# Patient Record
Sex: Male | Born: 1950 | Race: White | Hispanic: No | Marital: Married | Smoking: Never smoker
Health system: Southern US, Community
[De-identification: ages and names within clinical notes are randomized; demographics above are authoritative.]

## PROBLEM LIST (undated history)

## (undated) DIAGNOSIS — E785 Hyperlipidemia, unspecified: Secondary | ICD-10-CM

## (undated) DIAGNOSIS — E119 Type 2 diabetes mellitus without complications: Secondary | ICD-10-CM

## (undated) DIAGNOSIS — I10 Essential (primary) hypertension: Secondary | ICD-10-CM

## (undated) DIAGNOSIS — K219 Gastro-esophageal reflux disease without esophagitis: Secondary | ICD-10-CM

## (undated) DIAGNOSIS — F419 Anxiety disorder, unspecified: Secondary | ICD-10-CM

## (undated) DIAGNOSIS — E039 Hypothyroidism, unspecified: Secondary | ICD-10-CM

## (undated) HISTORY — DX: Anxiety disorder, unspecified: F41.9

## (undated) HISTORY — PX: ROTATOR CUFF REPAIR: SHX139

## (undated) HISTORY — PX: OTHER SURGICAL HISTORY: SHX169

## (undated) HISTORY — PX: MENISCUS REPAIR: SHX5179

## (undated) HISTORY — DX: Hyperlipidemia, unspecified: E78.5

## (undated) HISTORY — DX: Type 2 diabetes mellitus without complications: E11.9

## (undated) HISTORY — PX: JOINT REPLACEMENT: SHX530

## (undated) HISTORY — DX: Gastro-esophageal reflux disease without esophagitis: K21.9

## (undated) HISTORY — PX: TONSILLECTOMY: SUR1361

---

## 2015-05-05 ENCOUNTER — Encounter: Payer: Self-pay | Admitting: Emergency Medicine

## 2015-05-05 ENCOUNTER — Ambulatory Visit
Admission: EM | Admit: 2015-05-05 | Discharge: 2015-05-05 | Disposition: A | Discharge status: Home / Self Care | Payer: BLUE CROSS/BLUE SHIELD | Attending: Family Medicine | Admitting: Family Medicine

## 2015-05-05 DIAGNOSIS — J209 Acute bronchitis, unspecified: Secondary | ICD-10-CM | POA: Diagnosis not present

## 2015-05-05 DIAGNOSIS — J069 Acute upper respiratory infection, unspecified: Secondary | ICD-10-CM | POA: Diagnosis not present

## 2015-05-05 HISTORY — DX: Essential (primary) hypertension: I10

## 2015-05-05 HISTORY — DX: Hypothyroidism, unspecified: E03.9

## 2015-05-05 MED ORDER — PREDNISONE 10 MG (21) PO TBPK: ORAL_TABLET | ORAL | Status: DC

## 2015-05-05 MED ORDER — HYDROCOD POLST-CPM POLST ER 10-8 MG/5ML PO SUER: 5.0000 mL | Freq: Two times a day (BID) | ORAL | Status: DC | PRN

## 2015-05-05 MED ORDER — AZITHROMYCIN 250 MG PO TABS: ORAL_TABLET | ORAL | Status: DC

## 2015-05-05 MED ORDER — ALBUTEROL SULFATE HFA 108 (90 BASE) MCG/ACT IN AERS: 2.0000 | INHALATION_SPRAY | RESPIRATORY_TRACT | Status: DC | PRN

## 2015-05-05 NOTE — ED Notes (Signed)
Pt states he has bronchitis

## 2015-05-05 NOTE — ED Provider Notes (Signed)
CSN: [id number]     Arrival date & time 05/05/15  1439 History   First MD Initiated Contact with Patient 05/05/15 1512    Nurses notes were reviewed.  Chief Complaint  Patient presents with  . Bronchitis   patient is here for bronchitis. He reports history of getting bronchitis either every other year or yearly. He states this time he got his usual irritation of the throat cough and then barking cough hoarseness and productive cough but this time is still progressing quickly it's taken about a month. He's had the coughing and irritation throat about a month over the last several days felt that it has been moving into his chest and cough is gotten a lot worse with finally brownish sputum being produced today. He states he is not noticing wheezing this time but that does occur often when he does develop bronchitis and the cough has been keeping him up at night as well. He denies any fever. No history of smoking and no smokes around him either.  (Consider location/radiation/quality/duration/timing/severity/associated sxs/prior Treatment) Patient is a 64 y.o. male presenting with URI. The history is provided by the patient and the spouse. No language interpreter was used.  URI Presenting symptoms: congestion, cough and facial pain   Presenting symptoms: no ear pain, no fever and no sore throat   Severity:  Moderate Onset quality:  Unable to specify Duration:  4 weeks Progression:  Worsening Chronicity:  New Relieved by:  Nothing Ineffective treatments:  OTC medications Associated symptoms: headaches   Associated symptoms: no myalgias, no sinus pain, no sneezing and no wheezing   Risk factors: not elderly, no chronic cardiac disease, no immunosuppression and no recent illness     Past Medical History  Diagnosis Date  . Hypertension   . Thyroid activity decreased    Past Surgical History  Procedure Laterality Date  . Rotate     History reviewed. No pertinent family history. Social  History  Substance Use Topics  . Smoking status: Never Smoker   . Smokeless tobacco: None  . Alcohol Use: No  He is a retired Art therapist.   Review of Systems  Constitutional: Negative for fever.  HENT: Positive for congestion. Negative for ear pain, sneezing and sore throat.   Respiratory: Positive for cough. Negative for wheezing.   Musculoskeletal: Negative for myalgias.  Neurological: Positive for headaches.  All other systems reviewed and are negative.   Allergies  Fentanyl  Home Medications   Prior to Admission medications   Medication Sig Start Date End Date Taking? Authorizing Provider  dicyclomine (BENTYL) 10 MG capsule Take 10 mg by mouth 4 (four) times daily -  before meals and at bedtime.   Yes Historical Provider, MD  fluticasone (FLONASE) 50 MCG/ACT nasal spray Place into both nostrils daily.   Yes Historical Provider, MD  lisinopril (PRINIVIL,ZESTRIL) 10 MG tablet Take 10 mg by mouth daily.   Yes Historical Provider, MD  LORazepam (ATIVAN) 0.5 MG tablet Take 0.5 mg by mouth every 8 (eight) hours.   Yes Historical Provider, MD  metoprolol succinate (TOPROL-XL) 50 MG 24 hr tablet Take 50 mg by mouth daily. Take with or immediately following a meal.   Yes Historical Provider, MD  omeprazole (PRILOSEC) 20 MG capsule Take 20 mg by mouth daily.   Yes Historical Provider, MD  simvastatin (ZOCOR) 10 MG tablet Take 10 mg by mouth daily.   Yes Historical Provider, MD  venlafaxine XR (EFFEXOR-XR) 37.5 MG 24 hr capsule Take 37.5 mg  by mouth daily with breakfast.   Yes Historical Provider, MD  albuterol (PROVENTIL HFA;VENTOLIN HFA) 108 (90 BASE) MCG/ACT inhaler Inhale 2 puffs into the lungs every 4 (four) hours as needed for wheezing or shortness of breath. 05/05/15   Frederich Cha, MD  azithromycin (ZITHROMAX Z-PAK) 250 MG tablet Take 2 tablets first day and then 1 po a day for 4 days 05/05/15   Frederich Cha, MD  chlorpheniramine-HYDROcodone [facility]) 10-8 MG/5ML  SUER Take 5 mLs by mouth every 12 (twelve) hours as needed for cough. 05/05/15   Frederich Cha, MD  predniSONE (STERAPRED UNI-PAK 21 TAB) 10 MG (21) TBPK tablet Sig 6 tablet day 1, 5 tablets day 2, 4 tablets day 3,,3tablets day 4, 2 tablets day 5, 1 tablet day 6 take all tablets orally 05/05/15   Frederich Cha, MD   Meds Ordered and Administered this Visit  Medications - No data to display  BP 140/79 mmHg  Pulse 67  Temp(Src) 98.1 F (36.7 C) (Tympanic)  Resp 20  Ht 5\' 10"  (1.778 m)  Wt 199 lb (90.266 kg)  BMI 28.55 kg/m2  SpO2 98% No data found.   Physical Exam  Constitutional: He is oriented to person, place, and time. He appears well-developed and well-nourished.  HENT:  Head: Atraumatic.  Eyes: Conjunctivae are normal. Pupils are equal, round, and reactive to light.  Neck: Neck supple.  Cardiovascular: Normal rate, regular rhythm and normal heart sounds.   Pulmonary/Chest: Effort normal and breath sounds normal. No respiratory distress.  Musculoskeletal: Normal range of motion. He exhibits no edema.  Neurological: He is alert and oriented to person, place, and time. He has normal reflexes.  Skin: Skin is warm and dry.  Psychiatric: He has a normal mood and affect. His behavior is normal.  Vitals reviewed.   ED Course  Procedures (including critical care time)  Labs Review Labs Reviewed - No data to display  Imaging Review No results found.   Visual Acuity Review  Right Eye Distance:   Left Eye Distance:   Bilateral Distance:    Right Eye Near:   Left Eye Near:    Bilateral Near:         MDM   1. Acute bronchitis, unspecified organism   2. URI (upper respiratory infection)     he states that sometimes he has to have a second round of antibiotics I asked him call me back next Thursday if he feels he needs more of the current antibiotic were placed him on. He declined work note. We'll place him on a Z-Pak 60 course of prednisone, albuterol inhaler if needed  and Tussionex 1 teaspoon twice a day when necessary for the cough.  Frederich Cha, MD 05/05/15 1537

## 2015-05-05 NOTE — Discharge Instructions (Signed)
Acute Bronchitis Bronchitis is when the airways that extend from the windpipe into the lungs get red, puffy, and painful (inflamed). Bronchitis often causes thick spit (mucus) to develop. This leads to a cough. A cough is the most common symptom of bronchitis. In acute bronchitis, the condition usually begins suddenly and goes away over time (usually in 2 weeks). Smoking, allergies, and asthma can make bronchitis worse. Repeated episodes of bronchitis may cause more lung problems. HOME CARE  Rest.  Drink enough fluids to keep your pee (urine) clear or pale yellow (unless you need to limit fluids as told by your doctor).  Only take over-the-counter or prescription medicines as told by your doctor.  Avoid smoking and secondhand smoke. These can make bronchitis worse. If you are a smoker, think about using nicotine gum or skin patches. Quitting smoking will help your lungs heal faster.  Reduce the chance of getting bronchitis again by:  Washing your hands often.  Avoiding people with cold symptoms.  Trying not to touch your hands to your mouth, nose, or eyes.  Follow up with your doctor as told. GET HELP IF: Your symptoms do not improve after 1 week of treatment. Symptoms include:  Cough.  Fever.  Coughing up thick spit.  Body aches.  Chest congestion.  Chills.  Shortness of breath.  Sore throat. GET HELP RIGHT AWAY IF:   You have an increased fever.  You have chills.  You have severe shortness of breath.  You have bloody thick spit (sputum).  You throw up (vomit) often.  You lose too much body fluid (dehydration).  You have a severe headache.  You faint. MAKE SURE YOU:   Understand these instructions.  Will watch your condition.  Will get help right away if you are not doing well or get worse.   This information is not intended to replace advice given to you by your health care provider. Make sure you discuss any questions you have with your health care  provider.   Document Released: 12/11/2007 Document Revised: 02/24/2013 Document Reviewed: 12/15/2012 Elsevier Interactive Patient Education 2016 Else[employer]  Upper Respiratory Infection, Adult Most upper respiratory infections (URIs) are caused by a virus. A URI affects the nose, throat, and upper air passages. The most common type of URI is often called "the common cold." HOME CARE   Take medicines only as told by your doctor.  Gargle warm saltwater or take cough drops to comfort your throat as told by your doctor.  Use a warm mist humidifier or inhale steam from a shower to increase air moisture. This may make it easier to breathe.  Drink enough fluid to keep your pee (urine) clear or pale yellow.  Eat soups and other clear broths.  Have a healthy diet.  Rest as needed.  Go back to work when your fever is gone or your doctor says it is okay.  You may need to stay home longer to avoid giving your URI to others.  You can also wear a face mask and wash your hands often to prevent spread of the virus.  Use your inhaler more if you have asthma.  Do not use any tobacco products, including cigarettes, chewing tobacco, or electronic cigarettes. If you need help quitting, ask your doctor. GET HELP IF:  You are getting worse, not better.  Your symptoms are not helped by medicine.  You have chills.  You are getting more short of breath.  You have brown or red mucus.  You have  yellow or brown discharge from your nose.  You have pain in your face, especially when you bend forward.  You have a fever.  You have puffy (swollen) neck glands.  You have pain while swallowing.  You have white areas in the back of your throat. GET HELP RIGHT AWAY IF:   You have very bad or constant:  Headache.  Ear pain.  Pain in your forehead, behind your eyes, and over your cheekbones (sinus pain).  Chest pain.  You have long-lasting (chronic) lung disease and any of the  following:  Wheezing.  Long-lasting cough.  Coughing up blood.  A change in your usual mucus.  You have a stiff neck.  You have changes in your:  Vision.  Hearing.  Thinking.  Mood. MAKE SURE YOU:   Understand these instructions.  Will watch your condition.  Will get help right away if you are not doing well or get worse.   This information is not intended to replace advice given to you by your health care provider. Make sure you discuss any questions you have with your health care provider.   Document Released: 12/11/2007 Document Revised: 11/08/2014 Document Reviewed: 09/29/2013 Elsevier Interactive Patient Education [employer].

## 2015-06-21 DIAGNOSIS — F4329 Adjustment disorder with other symptoms: Secondary | ICD-10-CM | POA: Insufficient documentation

## 2015-06-21 DIAGNOSIS — B002 Herpesviral gingivostomatitis and pharyngotonsillitis: Secondary | ICD-10-CM | POA: Insufficient documentation

## 2015-06-21 DIAGNOSIS — F4323 Adjustment disorder with mixed anxiety and depressed mood: Secondary | ICD-10-CM | POA: Insufficient documentation

## 2015-06-21 DIAGNOSIS — I10 Essential (primary) hypertension: Secondary | ICD-10-CM | POA: Insufficient documentation

## 2015-06-21 DIAGNOSIS — E782 Mixed hyperlipidemia: Secondary | ICD-10-CM | POA: Insufficient documentation

## 2015-06-21 DIAGNOSIS — E039 Hypothyroidism, unspecified: Secondary | ICD-10-CM | POA: Insufficient documentation

## 2016-02-20 ENCOUNTER — Other Ambulatory Visit: Payer: Self-pay | Admitting: Family Medicine

## 2016-02-20 ENCOUNTER — Ambulatory Visit
Admission: RE | Admit: 2016-02-20 | Discharge: 2016-02-20 | Disposition: A | Discharge status: Home / Self Care | Payer: BLUE CROSS/BLUE SHIELD | Source: Ambulatory Visit | Attending: Family Medicine | Admitting: Family Medicine

## 2016-02-20 DIAGNOSIS — M79604 Pain in right leg: Secondary | ICD-10-CM

## 2016-02-20 DIAGNOSIS — M79605 Pain in left leg: Secondary | ICD-10-CM

## 2016-02-20 DIAGNOSIS — M7989 Other specified soft tissue disorders: Secondary | ICD-10-CM | POA: Insufficient documentation

## 2016-02-20 DIAGNOSIS — R609 Edema, unspecified: Secondary | ICD-10-CM

## 2016-06-06 IMAGING — US US EXTREM LOW VENOUS*L*
1 series · 13 of 24 positions shown · non-contrast
Comparison: None.

CLINICAL DATA: Left lower extremity pain and edema since
experiencing left leg muscle tear last week. History of skin cancer.
Evaluate for DVT.



[Series 1: us extrem low venous*left* · 0.09mm/px · 13 of 32 slices shown]
[im 1/32]
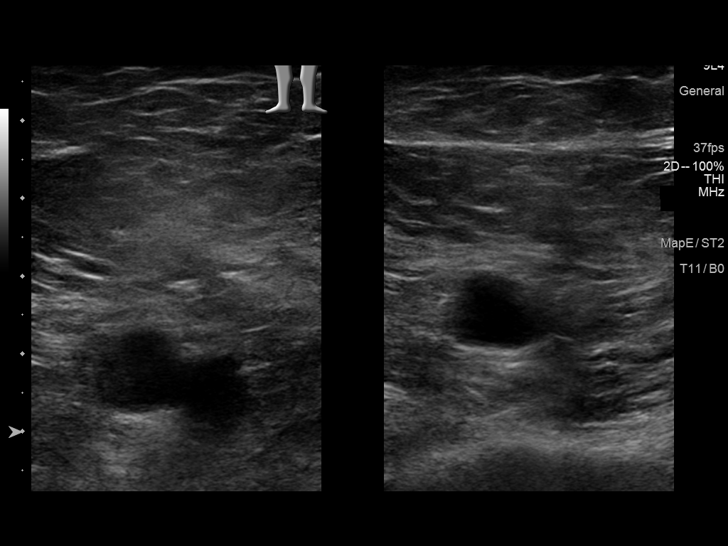
[im 3/32]
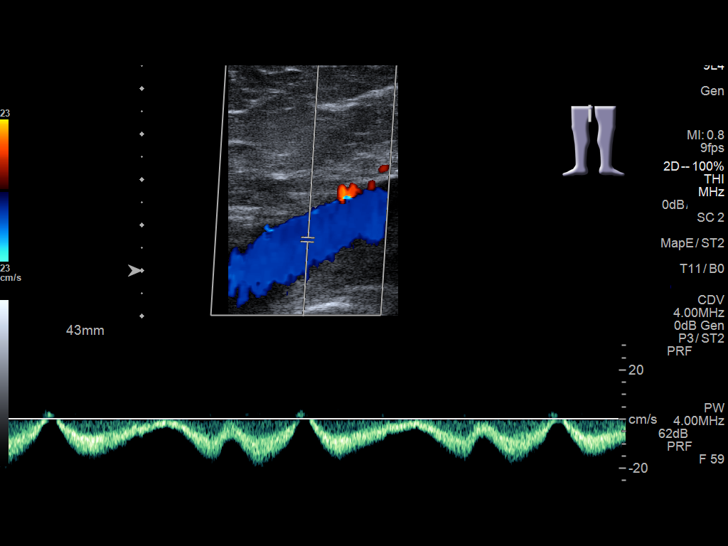
[im 6/32]
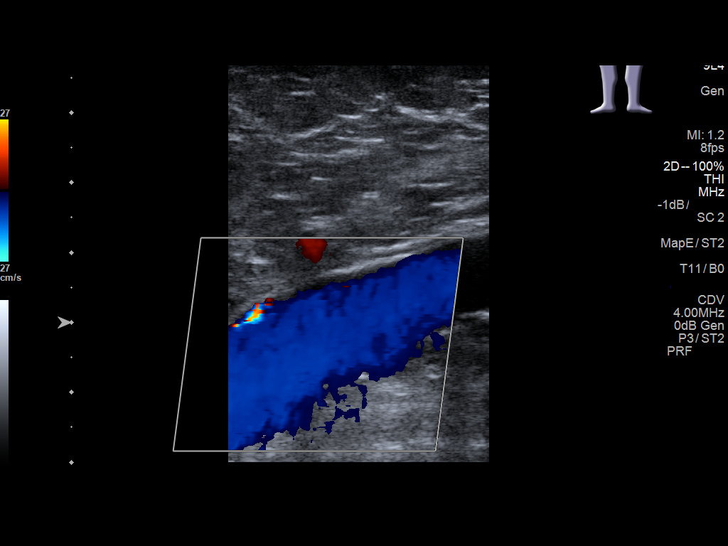
[im 9/32]
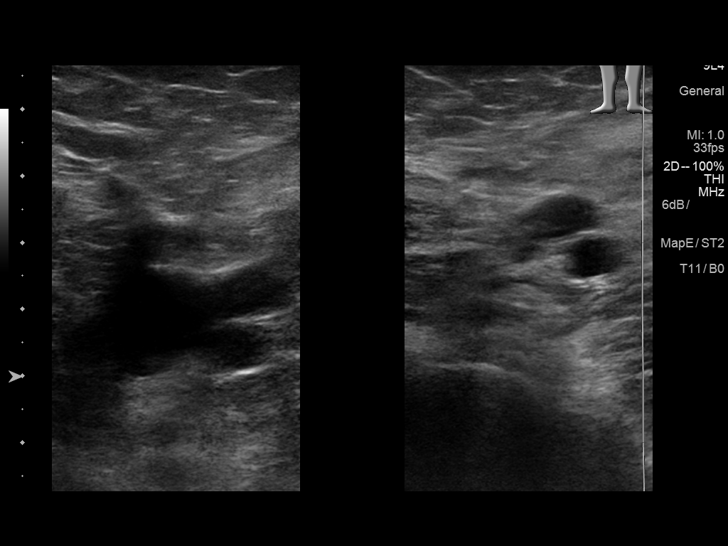
[im 11/32]
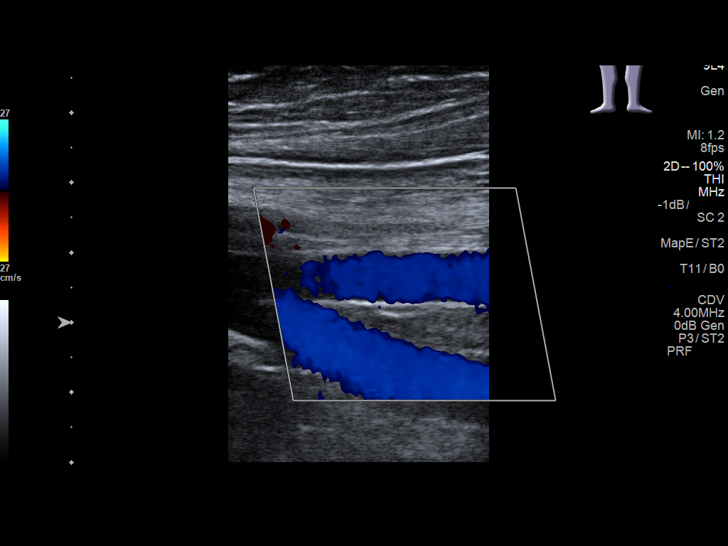
[im 14/32]
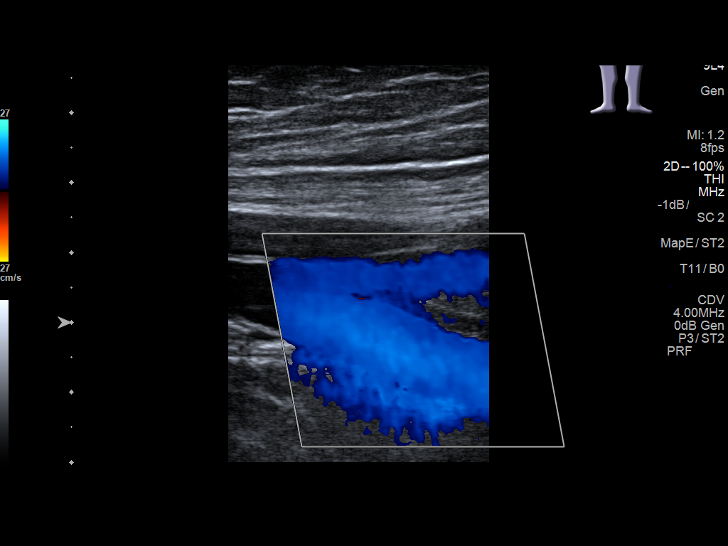
[im 17/32]
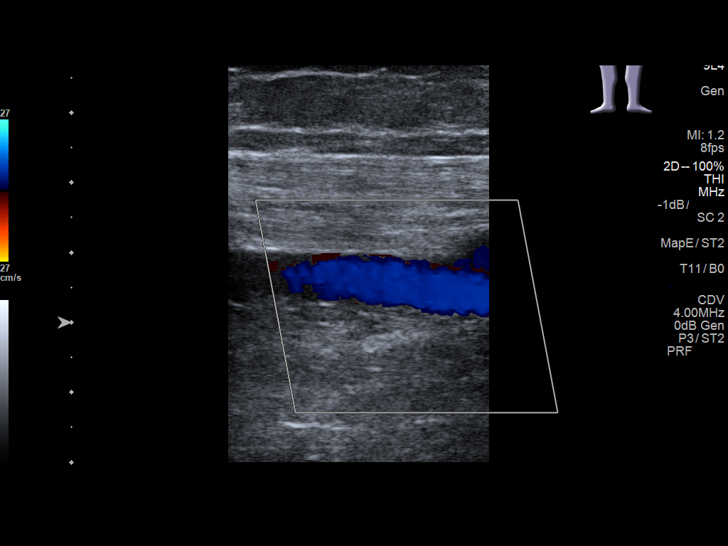
[im 18/32]
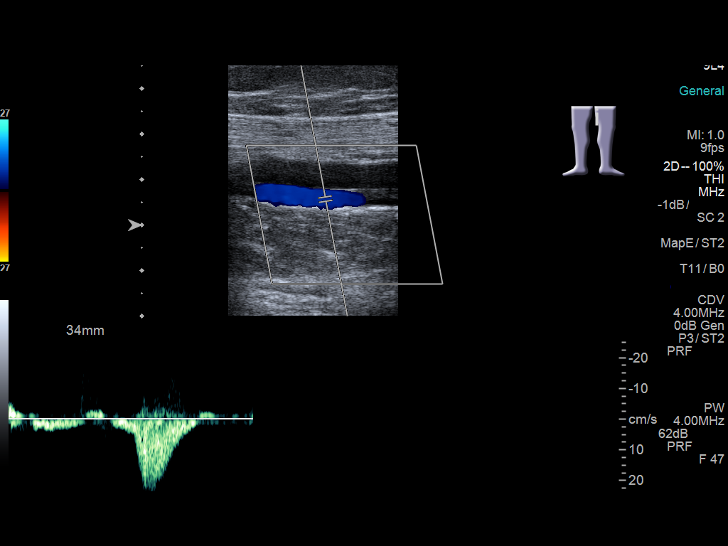
[im 21/32]
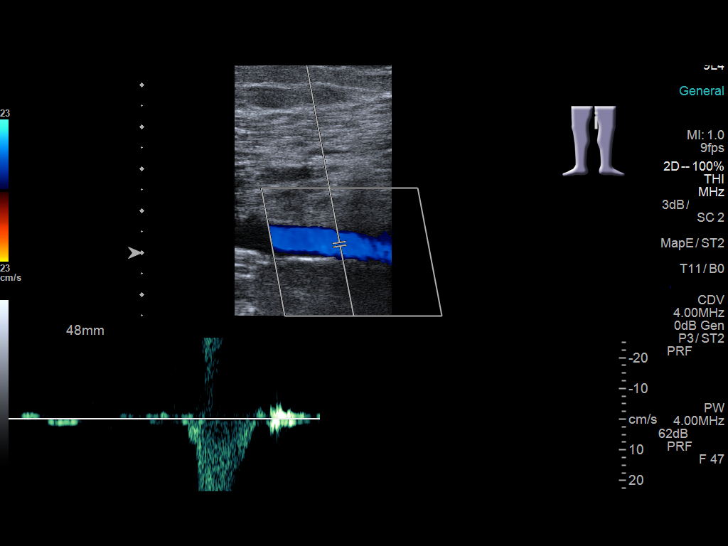
[im 23/32]
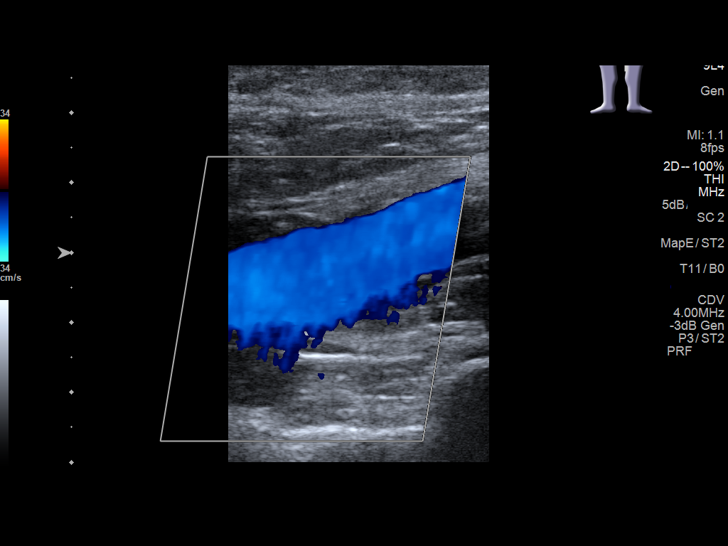
[im 26/32]
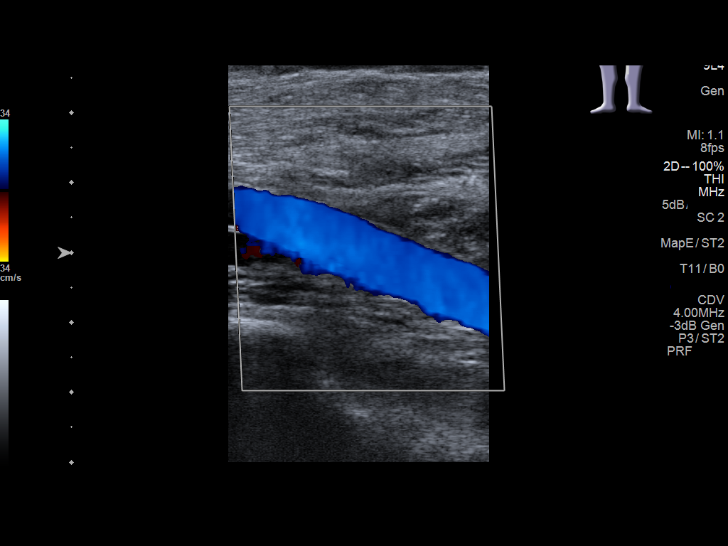
[im 29/32]
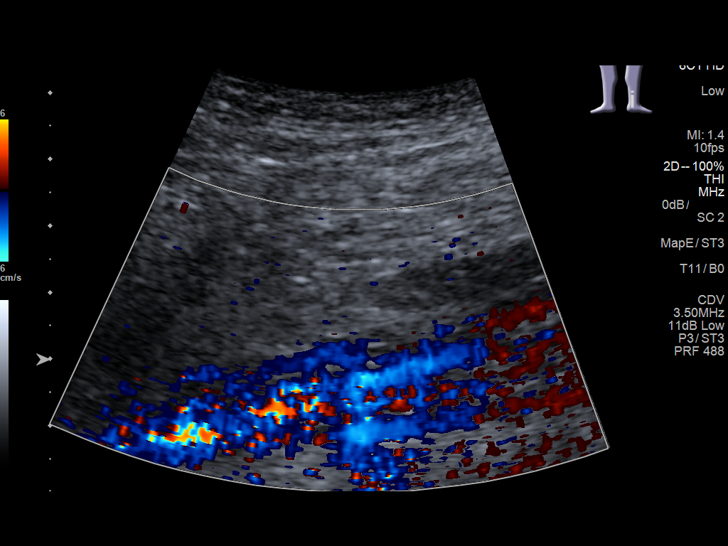
[im 32/32]
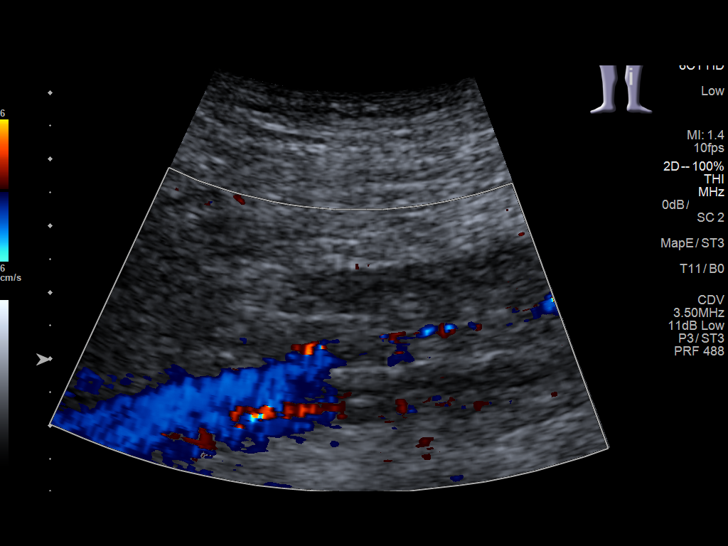

[13 of 24 positions shown; findings below may reference images not displayed]

FINDINGS: Contralateral Common Femoral Vein: Respiratory phasicity is normal
and symmetric with the symptomatic side. No evidence of thrombus.
Normal compressibility.

Common Femoral Vein: No evidence of thrombus. Normal
compressibility, respiratory phasicity and response to augmentation.

Saphenofemoral Junction: No evidence of thrombus. Normal
compressibility and flow on color Doppler imaging.

Profunda Femoral Vein: No evidence of thrombus. Normal
compressibility and flow on color Doppler imaging.

Femoral Vein: No evidence of thrombus. Normal compressibility,
respiratory phasicity and response to augmentation.

Popliteal Vein: No evidence of thrombus. Normal compressibility,
respiratory phasicity and response to augmentation.

Calf Veins: No evidence of thrombus. Normal compressibility and flow
on color Doppler imaging.

Superficial Great Saphenous Vein: No evidence of thrombus. Normal
compressibility and flow on color Doppler imaging.

Venous Reflux:  None.

Other Findings:  None.
IMPRESSION: No evidence of DVT within the left lower extremity.

## 2016-06-29 ENCOUNTER — Encounter: Payer: Self-pay | Admitting: Emergency Medicine

## 2016-06-29 ENCOUNTER — Ambulatory Visit
Admission: EM | Admit: 2016-06-29 | Discharge: 2016-06-29 | Disposition: A | Discharge status: Home / Self Care | Payer: BLUE CROSS/BLUE SHIELD | Attending: Family Medicine | Admitting: Family Medicine

## 2016-06-29 DIAGNOSIS — J40 Bronchitis, not specified as acute or chronic: Secondary | ICD-10-CM | POA: Diagnosis not present

## 2016-06-29 DIAGNOSIS — R05 Cough: Secondary | ICD-10-CM | POA: Diagnosis not present

## 2016-06-29 DIAGNOSIS — R059 Cough, unspecified: Secondary | ICD-10-CM

## 2016-06-29 MED ORDER — PREDNISONE 20 MG PO TABS: 20.0000 mg | ORAL_TABLET | Freq: Every day | ORAL | 0 refills | Status: DC

## 2016-06-29 MED ORDER — AZITHROMYCIN 250 MG PO TABS: ORAL_TABLET | ORAL | 0 refills | Status: DC

## 2016-06-29 MED ORDER — BENZONATATE 100 MG PO CAPS: 100.0000 mg | ORAL_CAPSULE | Freq: Three times a day (TID) | ORAL | 0 refills | Status: DC

## 2016-06-29 NOTE — ED Provider Notes (Signed)
[facility]    CSN: [id number] Arrival date & time: 06/29/16  0903     History   Chief Complaint Chief Complaint  Patient presents with  . Cough    HPI Jonathon Wise is a 65 y.o. male.   The history is provided by the patient.  Cough  Associated symptoms: fever   Associated symptoms: no headaches, no myalgias and no wheezing   URI  Presenting symptoms: cough, fatigue and fever   Severity:  Moderate Onset quality:  Sudden Duration:  8 days Timing:  Constant Progression:  Worsening Chronicity:  New Relieved by:  Nothing Ineffective treatments:  OTC medications Associated symptoms: no arthralgias, no headaches, no myalgias, no neck pain, no sinus pain, no sneezing, no swollen glands and no wheezing   Risk factors: sick contacts   Risk factors: not elderly, no chronic cardiac disease, no chronic kidney disease, no chronic respiratory disease, no diabetes mellitus, no immunosuppression, no recent illness and no recent travel     Past Medical History:  Diagnosis Date  . Hypertension   . Thyroid activity decreased     There are no active problems to display for this patient.   Past Surgical History:  Procedure Laterality Date  . rotate         Home Medications    Prior to Admission medications   Medication Sig Start Date End Date Taking? Authorizing Provider  azithromycin (ZITHROMAX Z-PAK) 250 MG tablet 2 tabs po once day 1, then 1 tab po qd for next 4 days 06/29/16   Norval Gable, MD  benzonatate (TESSALON) 100 MG capsule Take 1 capsule (100 mg total) by mouth every 8 (eight) hours. 06/29/16   Norval Gable, MD  fluticasone (FLONASE) 50 MCG/ACT nasal spray Place into both nostrils daily.    Historical Provider, MD  lisinopril (PRINIVIL,ZESTRIL) 10 MG tablet Take 10 mg by mouth daily.    Historical Provider, MD  LORazepam (ATIVAN) 0.5 MG tablet Take 0.5 mg by mouth every 8 (eight) hours.    Historical Provider, MD  metoprolol succinate (TOPROL-XL)  50 MG 24 hr tablet Take 50 mg by mouth daily. Take with or immediately following a meal.    Historical Provider, MD  omeprazole (PRILOSEC) 20 MG capsule Take 20 mg by mouth daily.    Historical Provider, MD  predniSONE (DELTASONE) 20 MG tablet Take 1 tablet (20 mg total) by mouth daily. 06/29/16   Norval Gable, MD  simvastatin (ZOCOR) 10 MG tablet Take 10 mg by mouth daily.    Historical Provider, MD  venlafaxine XR (EFFEXOR-XR) 37.5 MG 24 hr capsule Take 37.5 mg by mouth daily with breakfast.    Historical Provider, MD    Family History History reviewed. No pertinent family history.  Social History Social History  Substance Use Topics  . Smoking status: Never Smoker  . Smokeless tobacco: Never Used  . Alcohol use No     Allergies   Fentanyl   Review of Systems Review of Systems  Constitutional: Positive for fatigue and fever.  HENT: Negative for sinus pain and sneezing.   Respiratory: Positive for cough. Negative for wheezing.   Musculoskeletal: Negative for arthralgias, myalgias and neck pain.  Neurological: Negative for headaches.     Physical Exam Triage Vital Signs ED Triage Vitals  Enc Vitals Group     BP 06/29/16 0958 135/77     Pulse Rate 06/29/16 0958 75     Resp 06/29/16 0958 16     Temp 06/29/16 0958  98 F (36.7 C)     Temp Source 06/29/16 0958 Oral     SpO2 06/29/16 0958 98 %     Weight 06/29/16 0958 210 lb (95.3 kg)     Height 06/29/16 0958 5\' 8"  (1.727 m)     Head Circumference --      Peak Flow --      Pain Score 06/29/16 1001 0     Pain Loc --      Pain Edu? --      Excl. in [place]? --    No data found.   Updated Vital Signs BP 135/77 (BP Location: Left Arm)   Pulse 75   Temp 98 F (36.7 C) (Oral)   Resp 16   Ht 5\' 8"  (1.727 m)   Wt 210 lb (95.3 kg)   SpO2 98%   BMI 31.93 kg/m   Visual Acuity Right Eye Distance:   Left Eye Distance:   Bilateral Distance:    Right Eye Near:   Left Eye Near:    Bilateral Near:     Physical Exam    Constitutional: He appears well-developed and well-nourished. No distress.  HENT:  Head: Normocephalic and atraumatic.  Right Ear: Tympanic membrane, external ear and ear canal normal.  Left Ear: Tympanic membrane, external ear and ear canal normal.  Nose: Nose normal.  Mouth/Throat: Uvula is midline, oropharynx is clear and moist and mucous membranes are normal. No oropharyngeal exudate or tonsillar abscesses.  Eyes: Conjunctivae and EOM are normal. Pupils are equal, round, and reactive to light. Right eye exhibits no discharge. Left eye exhibits no discharge. No scleral icterus.  Neck: Normal range of motion. Neck supple. No tracheal deviation present. No thyromegaly present.  Cardiovascular: Normal rate, regular rhythm and normal heart sounds.   Pulmonary/Chest: Effort normal. No stridor. No respiratory distress. He has no wheezes. He has no rales. He exhibits no tenderness.  Diffuse rhonchi  Lymphadenopathy:    He has no cervical adenopathy.  Neurological: He is alert.  Skin: Skin is warm and dry. No rash noted. He is not diaphoretic.  Nursing note and vitals reviewed.    UC Treatments / Results  Labs (all labs ordered are listed, but only abnormal results are displayed) Labs Reviewed - No data to display  EKG  EKG Interpretation None       Radiology No results found.  Procedures Procedures (including critical care time)  Medications Ordered in UC Medications - No data to display   Initial Impression / Assessment and Plan / UC Course  I have reviewed the triage vital signs and the nursing notes.  Pertinent labs & imaging results that were available during my care of the patient were reviewed by me and considered in my medical decision making (see chart for details).  Clinical Course       Final Clinical Impressions(s) / UC Diagnoses   Final diagnoses:  Cough  Bronchitis    New Prescriptions Discharge Medication List as of 06/29/2016 12:33 PM     START taking these medications   Details  azithromycin (ZITHROMAX Z-PAK) 250 MG tablet 2 tabs po once day 1, then 1 tab po qd for next 4 days, Normal    benzonatate (TESSALON) 100 MG capsule Take 1 capsule (100 mg total) by mouth every 8 (eight) hours., Starting Sat 06/29/2016, Normal    predniSONE (DELTASONE) 20 MG tablet Take 1 tablet (20 mg total) by mouth daily., Starting Sat 06/29/2016, Normal  1.diagnosis reviewed with patient 2. rx as per orders above; reviewed possible side effects, interactions, risks and benefits  3. Recommend supportive treatment with rest, fluids 4. Follow-up prn if symptoms worsen or don't improve   Norval Gable, MD 06/29/16 1316

## 2016-06-29 NOTE — ED Triage Notes (Signed)
Patient c/o productive cough and chest congestion for the past 6 days.

## 2016-08-04 DIAGNOSIS — F5221 Male erectile disorder: Secondary | ICD-10-CM | POA: Insufficient documentation

## 2016-08-29 DIAGNOSIS — J9801 Acute bronchospasm: Secondary | ICD-10-CM | POA: Insufficient documentation

## 2017-02-11 ENCOUNTER — Encounter: Payer: BLUE CROSS/BLUE SHIELD | Attending: Family Medicine | Admitting: *Deleted

## 2017-02-11 ENCOUNTER — Encounter: Payer: Self-pay | Admitting: *Deleted

## 2017-02-11 VITALS — BP 128/80 | Ht 68.0 in | Wt 198.9 lb

## 2017-02-11 DIAGNOSIS — E119 Type 2 diabetes mellitus without complications: Secondary | ICD-10-CM | POA: Diagnosis not present

## 2017-02-11 DIAGNOSIS — Z6830 Body mass index (BMI) 30.0-30.9, adult: Secondary | ICD-10-CM | POA: Diagnosis not present

## 2017-02-11 DIAGNOSIS — Z713 Dietary counseling and surveillance: Secondary | ICD-10-CM | POA: Diagnosis not present

## 2017-02-11 NOTE — Progress Notes (Signed)
Diabetes Self-Management Education  Visit Type: First/Initial  Appt. Start Time: 1435 Appt. End Time: 8841  02/11/2017  Mr. Jonathon Wise, identified by name and date of birth, is a 66 y.o. male with a diagnosis of Diabetes: Type 2.   ASSESSMENT  Blood pressure 128/80, height 5\' 8"  (1.727 m), weight 198 lb 14.4 oz (90.2 kg). Body mass index is 30.24 kg/m.      Diabetes Self-Management Education - 02/11/17 1617      Visit Information   Visit Type First/Initial     Initial Visit   Diabetes Type Type 2   Are you currently following a meal plan? Yes   What type of meal plan do you follow? "low carb - 7 or less per day"   Are you taking your medications as prescribed? Yes   Date Diagnosed 2 weeks ago     Health Coping   How would you rate your overall health? Good     Psychosocial Assessment   Patient Belief/Attitude about Diabetes Motivated to manage diabetes  "I am gald it is Type 2. I feel I can control it so emotions are not a problem."   Self-care barriers None   Self-management support Doctor's office;Family   Other persons present Spouse/SO   Patient Concerns Nutrition/Meal planning;Medication;Monitoring;Healthy Lifestyle;Problem Solving;Glycemic Control;Weight Control   Special Needs None   Preferred Learning Style Auditory;Visual;Hands on   [place] in progress   How often do you need to have someone help you when you read instructions, pamphlets, or other written materials from your doctor or pharmacy? 1 - Never   What is the last grade level you completed in school? Doctorate     Pre-Education Assessment   Patient understands the diabetes disease and treatment process. Needs Instruction   Patient understands incorporating nutritional management into lifestyle. Needs Instruction   Patient undertands incorporating physical activity into lifestyle. Needs Instruction   Patient understands using medications safely. Needs Instruction   Patient  understands monitoring blood glucose, interpreting and using results Needs Review   Patient understands prevention, detection, and treatment of acute complications. Needs Instruction   Patient understands prevention, detection, and treatment of chronic complications. Needs Instruction   Patient understands how to develop strategies to address psychosocial issues. Needs Instruction   Patient understands how to develop strategies to promote health/change behavior. Needs Instruction     Complications   Last HgB A1C per patient/outside source 12 %  01/13/17   How often do you check your blood sugar? 3-4 times/day   Fasting Blood glucose range (mg/dL) 70-129;130-179  Pt reports FBG's 121-138 mg/dL.    Postprandial Blood glucose range (mg/dL) --  Pt reports mid-day readings 127-158 mg/dL and bedtime 130-152 mg/dL.    Have you had a dilated eye exam in the past 12 months? Yes   Have you had a dental exam in the past 12 months? Yes   Are you checking your feet? Yes   How many days per week are you checking your feet? 4     Dietary Intake   Breakfast 3 eggs and 1-2 pieces of whole grain bread   Lunch 1 piece of toast with peanut butter or Healthy Choice soup   Snack (afternoon) peanuts   Dinner hamburger, pork, chicken, beef or fish with brussel sprouts, salads, peppers, onions   Snack (evening) popcorn   Beverage(s) water, diet soda, Crystal light, juice     Exercise   Exercise Type Light (walking / raking leaves)   How  many days per week to you exercise? 2   How many minutes per day do you exercise? 30   Total minutes per week of exercise 60     Patient Education   Previous Diabetes Education No   Disease state  Definition of diabetes, type 1 and 2, and the diagnosis of diabetes   Nutrition management  Role of diet in the treatment of diabetes and the relationship between the three main macronutrients and blood glucose level;Food label reading, portion sizes and measuring  food.;Carbohydrate counting;Reviewed blood glucose goals for pre and post meals and how to evaluate the patients' food intake on their blood glucose level.   Physical activity and exercise  Role of exercise on diabetes management, blood pressure control and cardiac health.   Medications Reviewed patients medication for diabetes, action, purpose, timing of dose and side effects.   Monitoring Purpose and frequency of SMBG.;Taught/discussed recording of test results and interpretation of SMBG.;Identified appropriate SMBG and/or A1C goals.   Chronic complications Relationship between chronic complications and blood glucose control   Psychosocial adjustment Identified and addressed patients feelings and concerns about diabetes     Individualized Goals (developed by patient)   Reducing Risk Improve blood sugars Decrease medications Prevent diabetes complications Lose weight Lead a healthier lifestyle Become more fit     Outcomes   Expected Outcomes Demonstrated interest in learning. Expect positive outcomes      Individualized Plan for Diabetes Self-Management Training:   Learning Objective:  Patient will have a greater understanding of diabetes self-management. Patient education plan is to attend individual and/or group sessions per assessed needs and concerns.   Plan:   Patient Instructions  Check blood sugars 2 x day before breakfast and 2 hrs after supper every day Bring blood sugar records to the next class Exercise: Continue walking  for   30  minutes  2-3 days a week and gradually increase to 30 minutes 5 x week Eat 3 meals day,  1-2  snacks a day Space meals 4-6 hours apart Limit fruit juice   Expected Outcomes:  Demonstrated interest in learning. Expect positive outcomes  Education material provided:  General Meal Planning Guidelines Simple Meal Plan  If problems or questions, patient to contact team via:  Johny Drilling, [place], [place], CDE [phone]  Future DSME  appointment:  February 13, 2017 for Diabetes Class 1

## 2017-02-11 NOTE — Patient Instructions (Signed)
Check blood sugars 2 x day before breakfast and 2 hrs after supper every day Bring blood sugar records to the next class  Exercise: Continue walking  for   30  minutes  2-3 days a week and gradually increase to 30 minutes 5 x week  Eat 3 meals day,  1-2  snacks a day Space meals 4-6 hours apart Limit fruit juice  Return for classes on:

## 2017-02-13 ENCOUNTER — Encounter: Payer: Self-pay | Admitting: Dietician

## 2017-02-13 ENCOUNTER — Encounter: Payer: BLUE CROSS/BLUE SHIELD | Admitting: Dietician

## 2017-02-13 VITALS — Ht 68.0 in | Wt 198.6 lb

## 2017-02-13 DIAGNOSIS — E119 Type 2 diabetes mellitus without complications: Secondary | ICD-10-CM

## 2017-02-13 NOTE — Progress Notes (Signed)

## 2017-03-27 ENCOUNTER — Encounter: Payer: Self-pay | Admitting: Dietician

## 2017-03-27 ENCOUNTER — Encounter: Payer: BLUE CROSS/BLUE SHIELD | Attending: Family Medicine | Admitting: Dietician

## 2017-03-27 VITALS — BP 124/82 | Ht 68.0 in | Wt 191.2 lb

## 2017-03-27 DIAGNOSIS — Z6830 Body mass index (BMI) 30.0-30.9, adult: Secondary | ICD-10-CM | POA: Diagnosis not present

## 2017-03-27 DIAGNOSIS — E119 Type 2 diabetes mellitus without complications: Secondary | ICD-10-CM | POA: Diagnosis not present

## 2017-03-27 DIAGNOSIS — Z713 Dietary counseling and surveillance: Secondary | ICD-10-CM | POA: Insufficient documentation

## 2017-03-27 NOTE — Progress Notes (Signed)

## 2017-04-17 ENCOUNTER — Encounter: Payer: BLUE CROSS/BLUE SHIELD | Attending: Family Medicine | Admitting: *Deleted

## 2017-04-17 ENCOUNTER — Encounter: Payer: Self-pay | Admitting: *Deleted

## 2017-04-17 VITALS — Wt 189.4 lb

## 2017-04-17 DIAGNOSIS — Z6830 Body mass index (BMI) 30.0-30.9, adult: Secondary | ICD-10-CM | POA: Insufficient documentation

## 2017-04-17 DIAGNOSIS — Z713 Dietary counseling and surveillance: Secondary | ICD-10-CM | POA: Insufficient documentation

## 2017-04-17 DIAGNOSIS — E119 Type 2 diabetes mellitus without complications: Secondary | ICD-10-CM | POA: Diagnosis not present

## 2017-04-17 NOTE — Progress Notes (Signed)

## 2017-04-22 ENCOUNTER — Encounter: Payer: Self-pay | Admitting: *Deleted

## 2017-08-28 ENCOUNTER — Ambulatory Visit
Admission: RE | Admit: 2017-08-28 | Discharge: 2017-08-28 | Disposition: A | Discharge status: Home / Self Care | Payer: Medicare HMO | Source: Ambulatory Visit | Attending: Medical Oncology | Admitting: Medical Oncology

## 2017-08-28 ENCOUNTER — Other Ambulatory Visit: Payer: Self-pay | Admitting: Medical Oncology

## 2017-08-28 DIAGNOSIS — R918 Other nonspecific abnormal finding of lung field: Secondary | ICD-10-CM | POA: Insufficient documentation

## 2017-08-28 DIAGNOSIS — R0989 Other specified symptoms and signs involving the circulatory and respiratory systems: Secondary | ICD-10-CM

## 2017-08-28 DIAGNOSIS — R058 Other specified cough: Secondary | ICD-10-CM

## 2017-08-28 DIAGNOSIS — R05 Cough: Secondary | ICD-10-CM

## 2017-08-28 IMAGING — CR DG CHEST 2V
4 series · 4 of 4 positions shown · non-contrast
Comparison: None.

CLINICAL DATA: Productive cough.

EXAM:
CHEST  2 VIEW

[chest lat (1 of 2)]
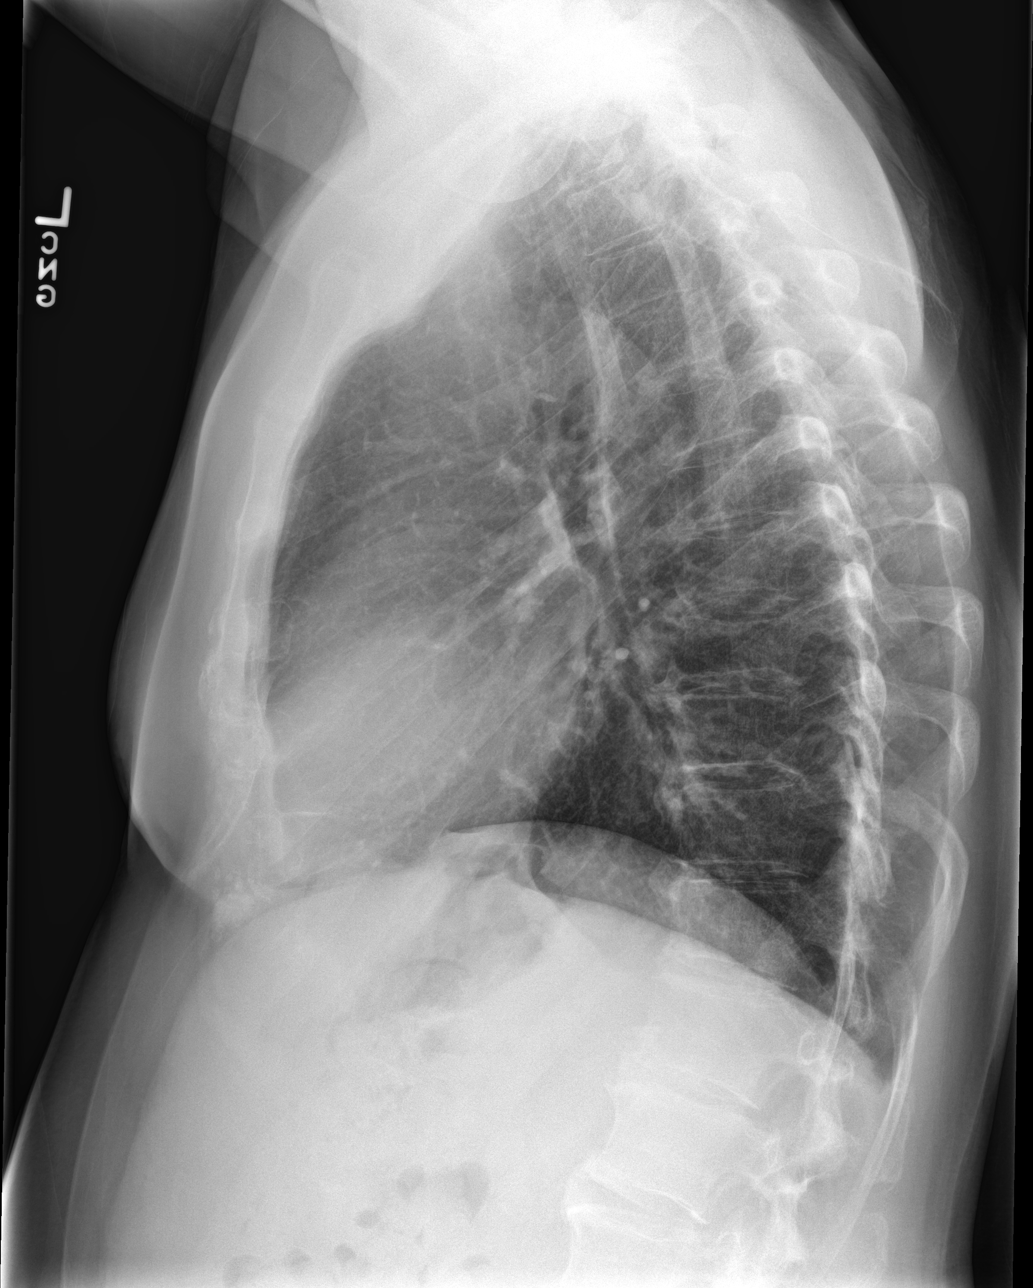

[chest pa (1 of 2)]
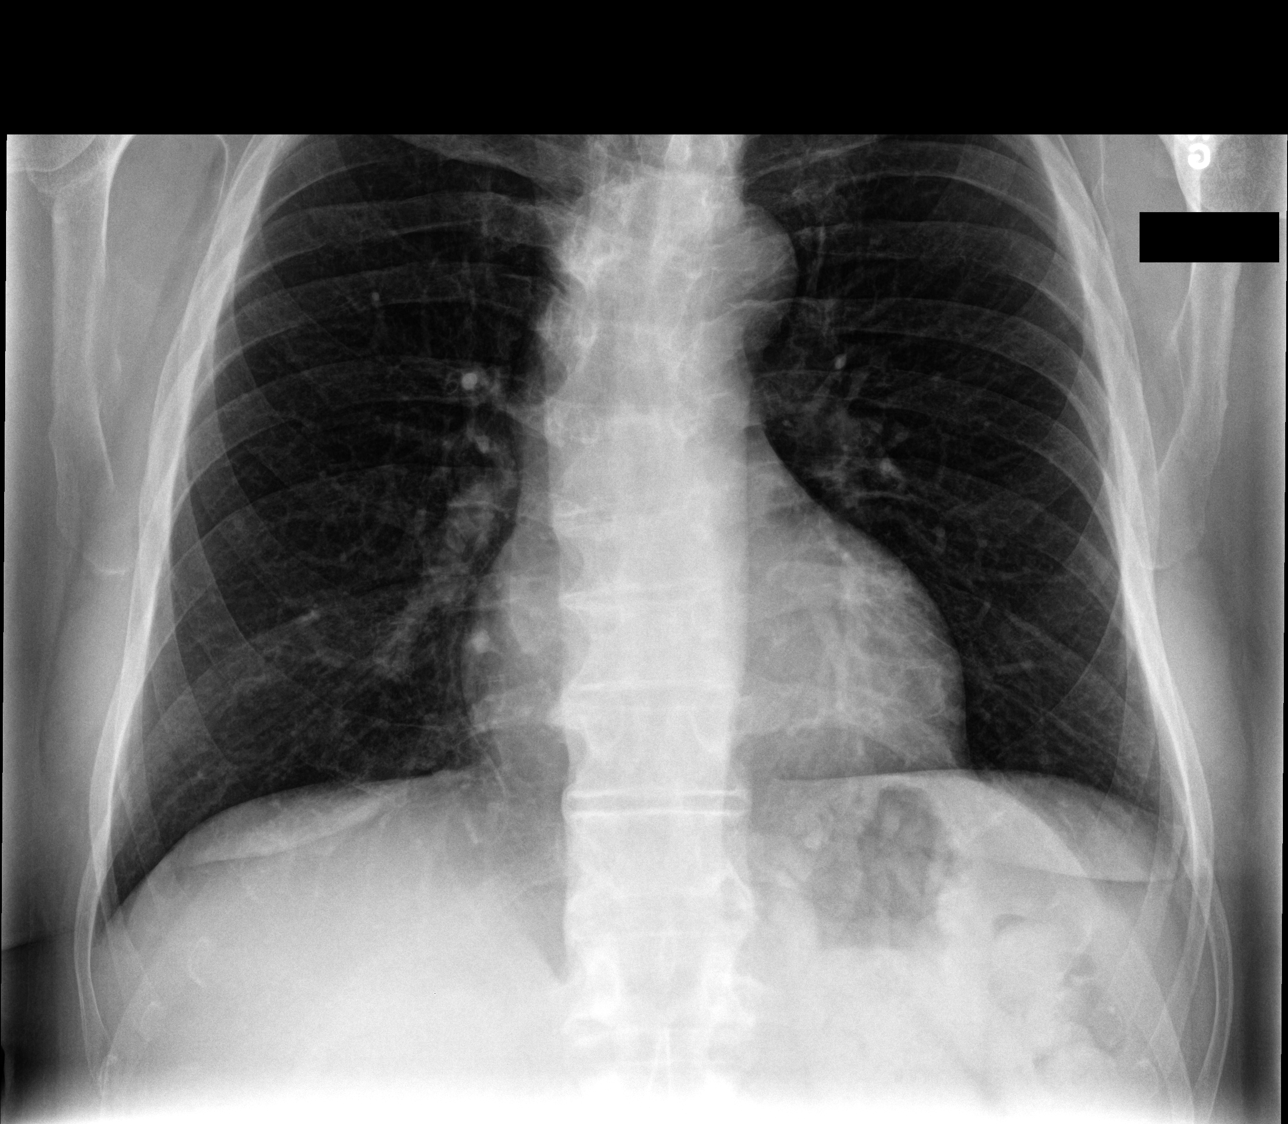

[chest pa (2 of 2)]
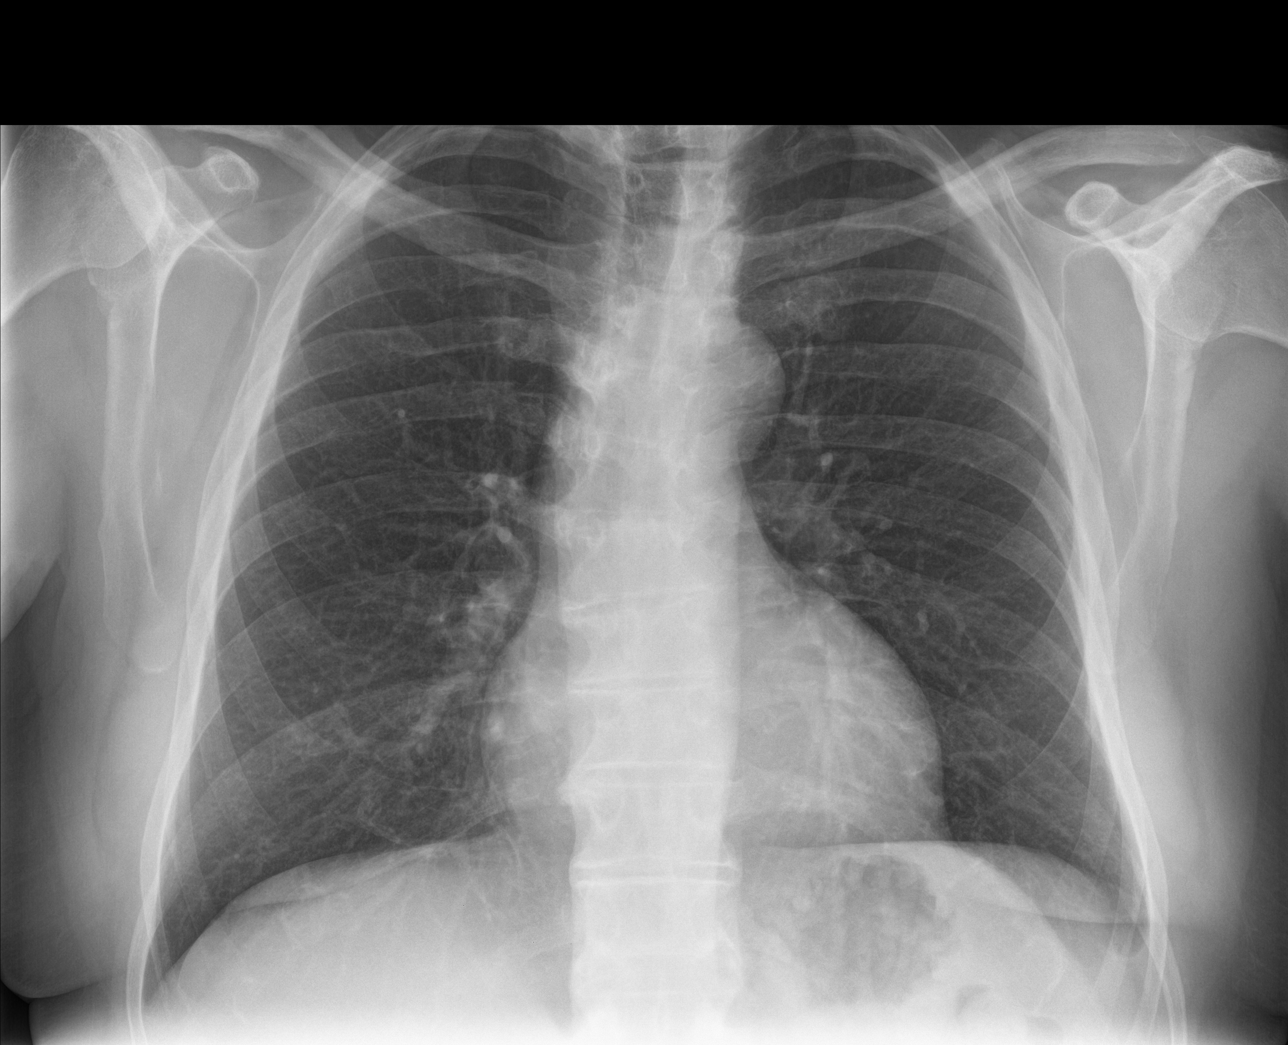

[chest lat (2 of 2)]
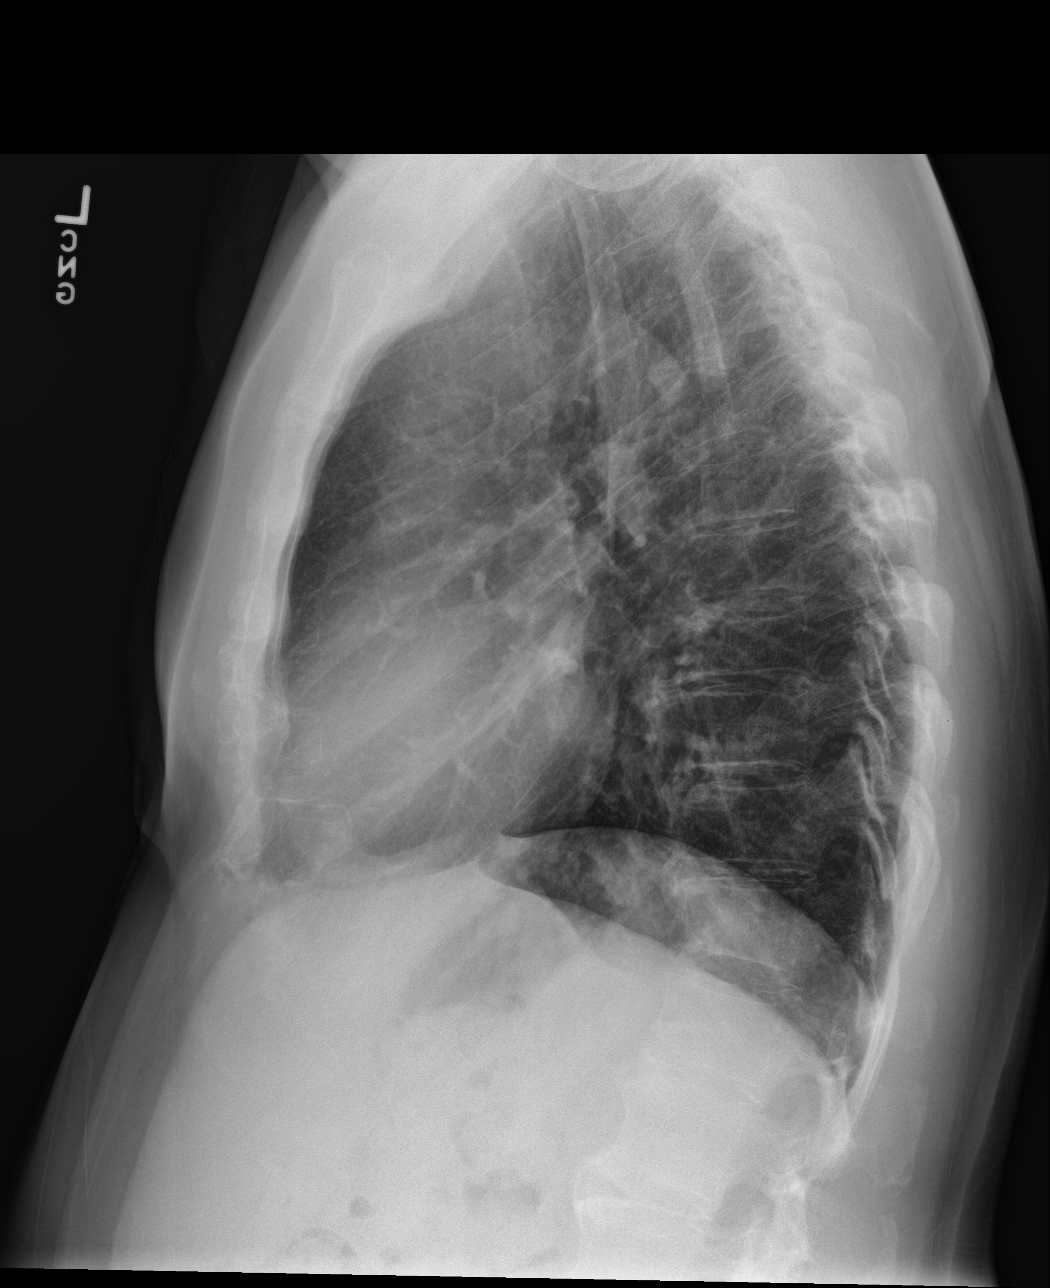

[4 of 4 positions shown; findings below may reference images not displayed]

FINDINGS: Heart size and mediastinal contours are within normal limits. Subtle
small opacity at the right lung base, suspicious for early
pneumonia. Left lung is clear. No pleural effusion or pneumothorax
seen.

Mild degenerative spurring within the thoracic spine. No acute or
suspicious osseous finding.
IMPRESSION: Subtle opacity within the right lower lobe, suspicious for
mild/early pneumonia. Recommend follow-up chest x-ray in 4-6 weeks
to ensure resolution.

## 2017-10-06 ENCOUNTER — Other Ambulatory Visit: Payer: Self-pay | Admitting: Medical Oncology

## 2017-10-06 ENCOUNTER — Ambulatory Visit
Admission: RE | Admit: 2017-10-06 | Discharge: 2017-10-06 | Disposition: A | Discharge status: Home / Self Care | Payer: Medicare HMO | Source: Ambulatory Visit | Attending: Medical Oncology | Admitting: Medical Oncology

## 2017-10-06 DIAGNOSIS — J189 Pneumonia, unspecified organism: Secondary | ICD-10-CM

## 2017-10-06 DIAGNOSIS — J181 Lobar pneumonia, unspecified organism: Principal | ICD-10-CM

## 2017-10-06 IMAGING — CR DG CHEST 2V
2 series · 2 of 2 positions shown · non-contrast
Comparison: [DATE]

CLINICAL DATA: Recent history of pneumonia. Not currently having
any symptoms

EXAM:
CHEST - 2 VIEW

[chest pa]
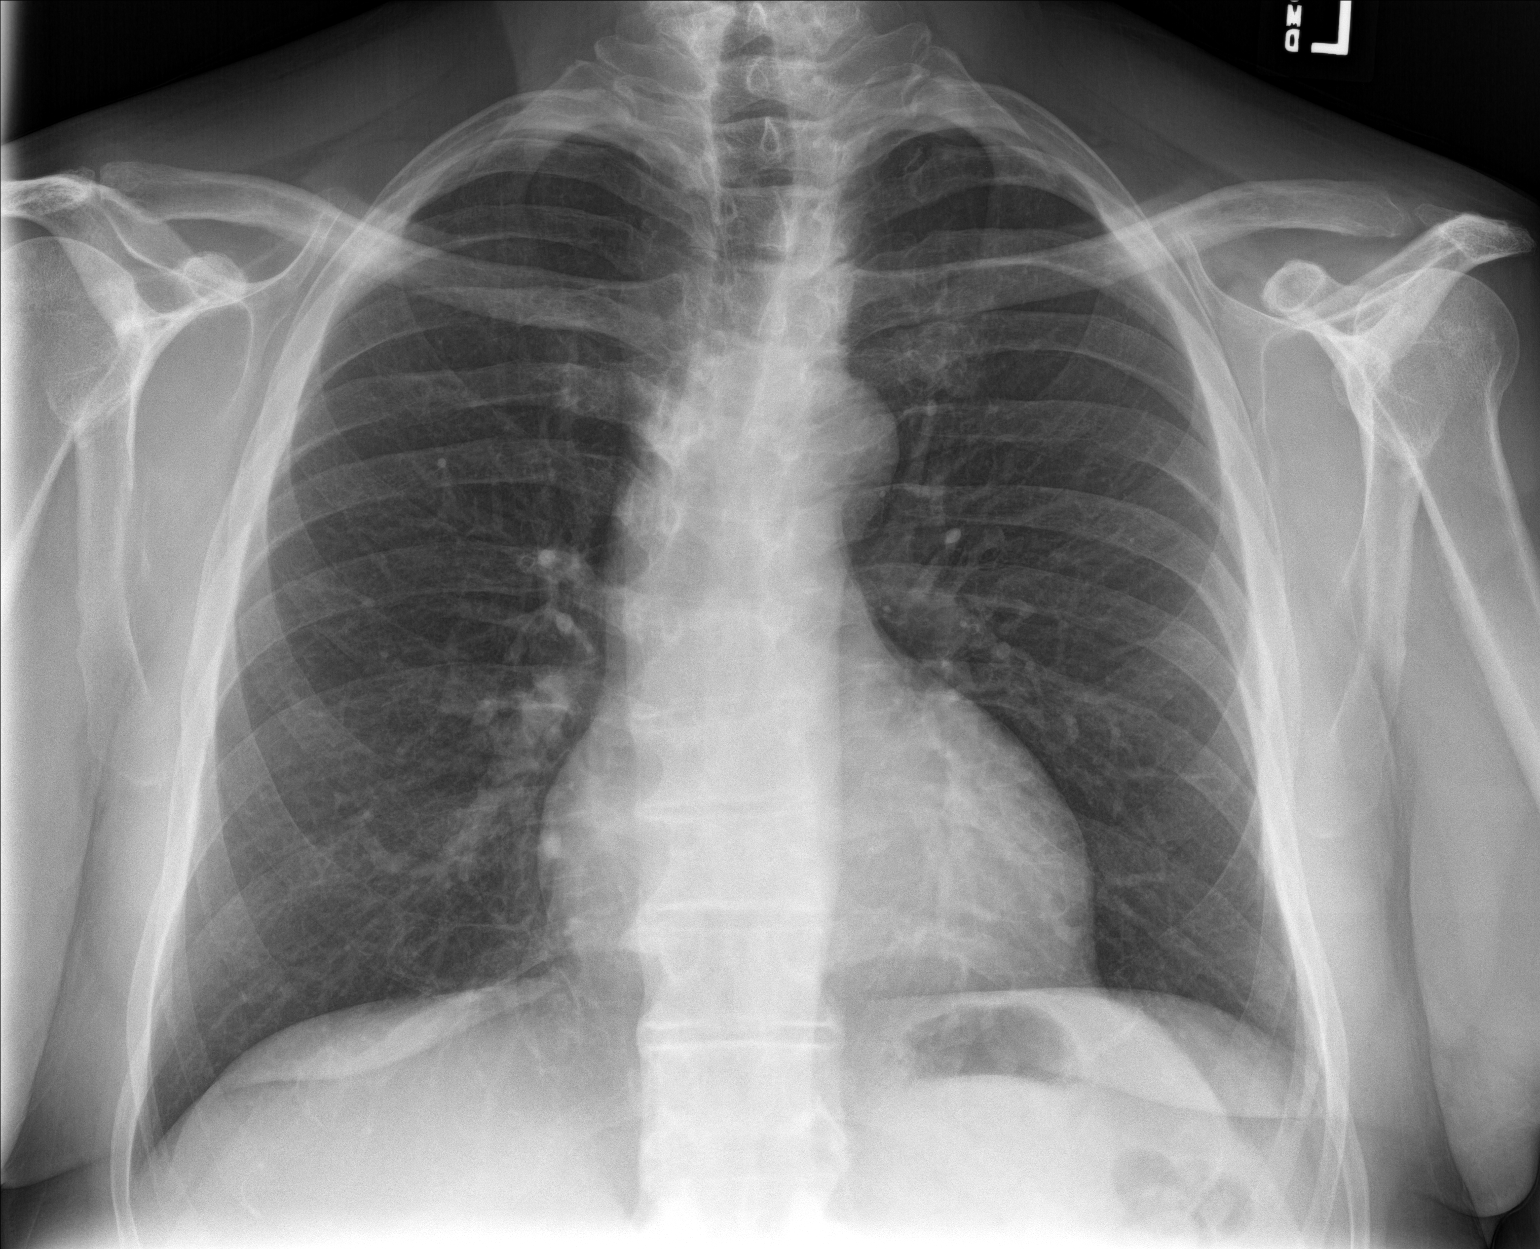

[chest lat]
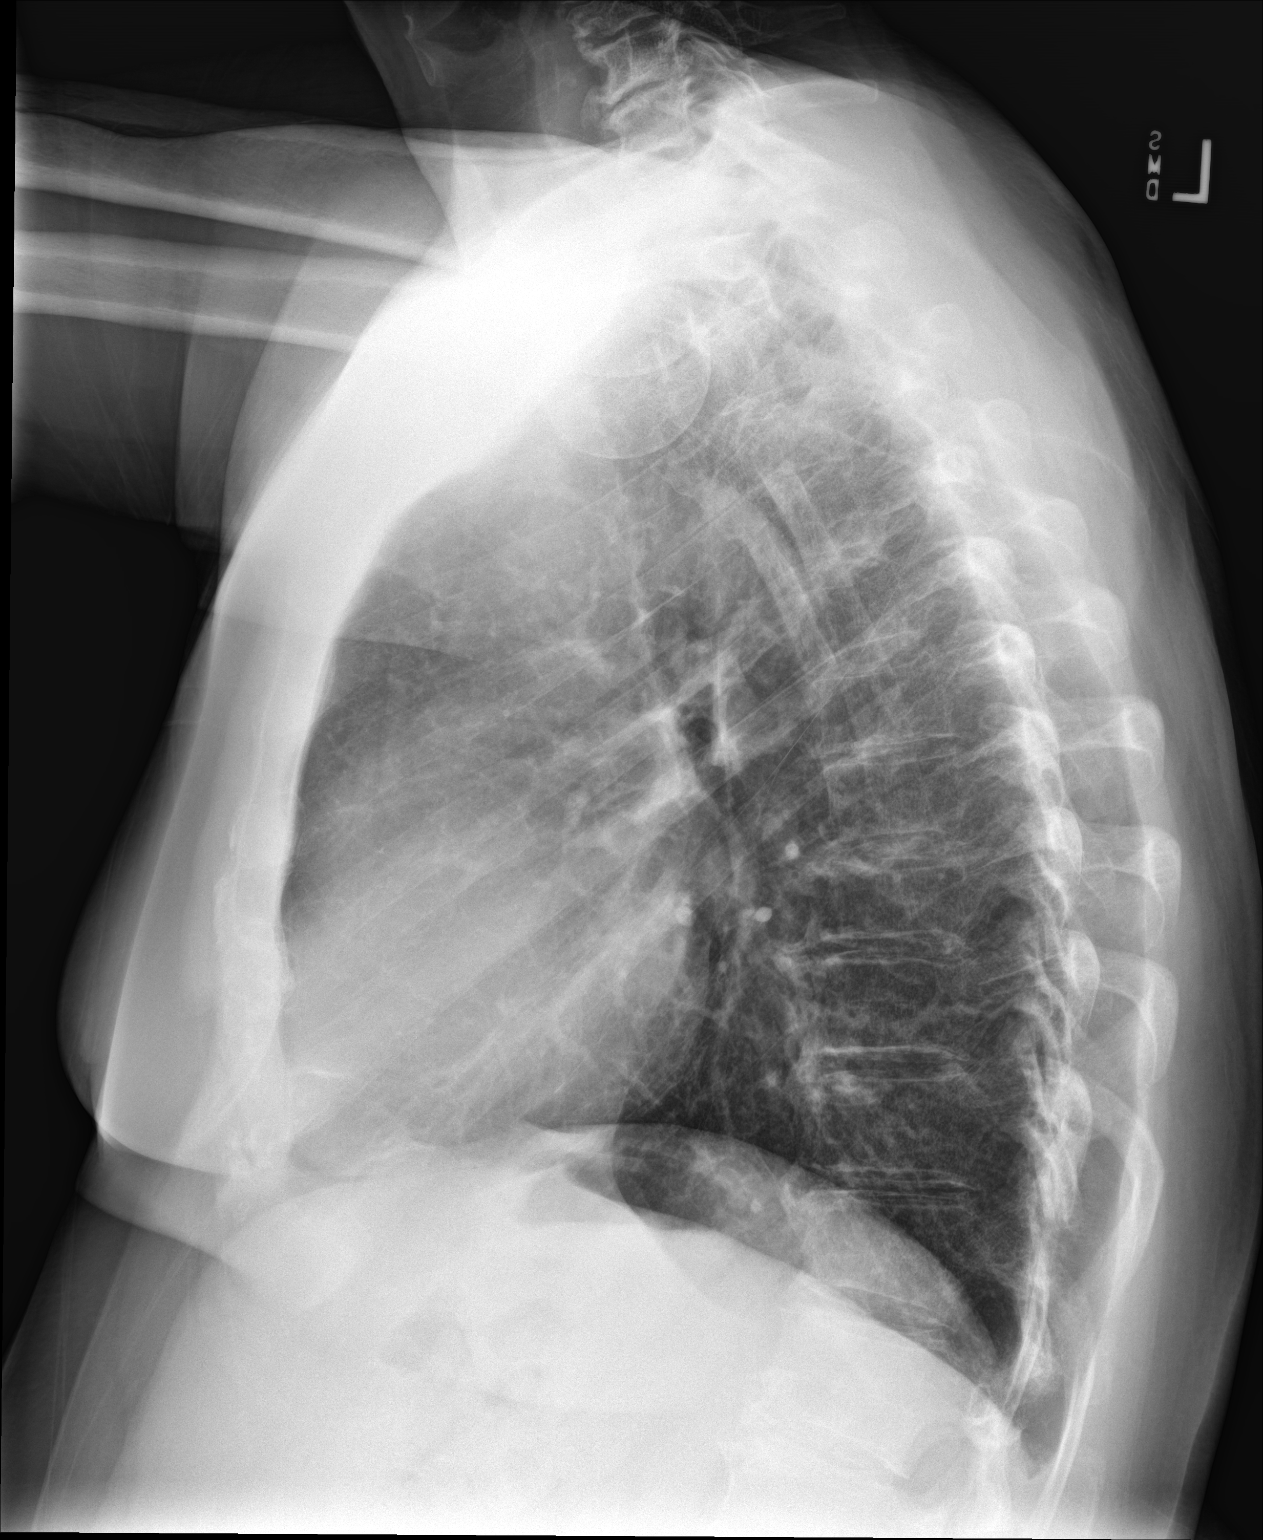

[2 of 2 positions shown; findings below may reference images not displayed]

FINDINGS: Cardiac silhouette is normal in size. Normal mediastinal and hilar
contours.

Clear lungs.

No pleural effusion or pneumothorax.

Skeletal structures are intact.
IMPRESSION: No active cardiopulmonary disease.

## 2017-10-20 DIAGNOSIS — K219 Gastro-esophageal reflux disease without esophagitis: Secondary | ICD-10-CM | POA: Insufficient documentation

## 2017-10-20 DIAGNOSIS — C4491 Basal cell carcinoma of skin, unspecified: Secondary | ICD-10-CM | POA: Insufficient documentation

## 2017-11-14 ENCOUNTER — Encounter: Payer: Self-pay | Admitting: *Deleted

## 2017-12-15 DIAGNOSIS — G2581 Restless legs syndrome: Secondary | ICD-10-CM | POA: Insufficient documentation

## 2018-01-27 DIAGNOSIS — R0789 Other chest pain: Secondary | ICD-10-CM | POA: Insufficient documentation

## 2018-01-27 DIAGNOSIS — J381 Polyp of vocal cord and larynx: Secondary | ICD-10-CM | POA: Insufficient documentation

## 2018-06-25 ENCOUNTER — Ambulatory Visit: Admission: RE | Admit: 2018-06-25 | Payer: Medicare HMO | Source: Home / Self Care

## 2018-07-13 DIAGNOSIS — G253 Myoclonus: Secondary | ICD-10-CM | POA: Insufficient documentation

## 2018-12-23 ENCOUNTER — Other Ambulatory Visit: Payer: Self-pay

## 2018-12-23 ENCOUNTER — Encounter: Payer: Self-pay | Admitting: Urology

## 2018-12-23 ENCOUNTER — Ambulatory Visit: Payer: Medicare HMO | Admitting: Urology

## 2018-12-23 VITALS — BP 158/88 | HR 82 | Ht 68.0 in | Wt 202.0 lb

## 2018-12-23 DIAGNOSIS — N4883 Acquired buried penis: Secondary | ICD-10-CM

## 2018-12-23 DIAGNOSIS — N521 Erectile dysfunction due to diseases classified elsewhere: Secondary | ICD-10-CM

## 2018-12-23 DIAGNOSIS — E1169 Type 2 diabetes mellitus with other specified complication: Secondary | ICD-10-CM | POA: Diagnosis not present

## 2018-12-23 DIAGNOSIS — N50819 Testicular pain, unspecified: Secondary | ICD-10-CM | POA: Diagnosis not present

## 2018-12-23 NOTE — Progress Notes (Signed)
12/23/2018 8:20 PM   Jonathon Wise 1950/11/11 [id number]  Referring provider: Nelwyn Salisbury, PA-C [place],  [place] [address]  Chief Complaint  Patient presents with  . New Patient (Initial Visit)    HPI:  68 year old male who presents today to establish care for multiple GU issues as below.  He was referred by his primary care, Nelwyn Salisbury PA.  He reports today that he has been having intermittent testicular pain since January.  Initially was on the right but now is more in the left side.  It tends to be exacerbated with physical activity.  He reports that is triggered by repositioning from a sitting to standing position.  Can also occur randomly.  The pain is dull and achy and can last hours to days at a time.  Comes and goes.  No alleviating factors.  He reports after exam by his primary care, he was tender for several days.  No known history of inguinal hernia.  No groin pain or masses/bulging.  No associated urinary symptoms.  He does not wear underwear.  Previous scrotal imaging.  In addition above, he is concerned today erectile dysfunction.  This been going on for several years.  His desire and libido is intact.  He is tried Viagra in the remote past with some success but also had some headaches.  More recently he was prescribed sildenafil by his PCP but has not yet tried this.  He is under the care of a cardiologist.  Link [place]uffer below.  Difficulty maintaining and achieving erections.  No spontaneous a.m. erections.  Finally today, he is concerned about retraction of his penis.  He reports over the past several years, his penis seems to be shrinking a more recently, he feels like it pulls into his body and is enveloped.  He has gained some weight recently.  He has no voiding difficulties related to this.  In terms of urination, he does have a fairly decent urinary stream.  No significant urgency or frequency.  Occasionally, his stream will spray somewhat  but not consistently.  Most recent PSA 01/2017 0.35.    SHIM    Row Name 12/23/18 1127         SHIM: Over the last 6 months:   How do you rate your confidence that you could get and keep an erection?  Very Low     When you had erections with sexual stimulation, how often were your erections hard enough for penetration (entering your partner)?  Almost Always or Always     During sexual intercourse, how often were you able to maintain your erection after you had penetrated (entered) your partner?  Almost Always or Always     During sexual intercourse, how difficult was it to maintain your erection to completion of intercourse?  Not Difficult     When you attempted sexual intercourse, how often was it satisfactory for you?  Almost Always or Always       SHIM Total Score   SHIM  21        PMH: Past Medical History:  Diagnosis Date  . Anxiety   . Diabetes mellitus without complication ([place])   . GERD (gastroesophageal reflux disease)   . Hyperlipidemia   . Hypertension   . Thyroid activity decreased     Surgical History: Past Surgical History:  Procedure Laterality Date  . JOINT REPLACEMENT Right    index  . MENISCUS REPAIR    . rotate    .  ROTATOR CUFF REPAIR     2 on right and 2 on left  . TONSILLECTOMY      Home Medications:  Allergies as of 12/23/2018      Reactions   Fentanyl Shortness Of Breath      Medication List       Accurate as of December 23, 2018 11:59 PM. If you have any questions, ask your nurse or doctor.        aspirin EC 81 MG tablet Take 81 mg by mouth daily.   Calcium 600+D 600-800 MG-UNIT Tabs Generic drug: Calcium Carb-Cholecalciferol Take 1 tablet by mouth daily.   fluticasone 50 MCG/ACT nasal spray Commonly known as: FLONASE Place 2 sprays into both nostrils daily.   lisinopril 10 MG tablet Commonly known as: ZESTRIL Take 10 mg by mouth daily.   metFORMIN 500 MG tablet Commonly known as: GLUCOPHAGE Take 500 mg by mouth 2 (two)  times daily.   metoprolol succinate 50 MG 24 hr tablet Commonly known as: TOPROL-XL Take 50 mg by mouth daily. Take with or immediately following a meal.   multivitamin tablet Take 1 tablet by mouth daily.   Omega-3 1000 MG Caps Take 1 capsule by mouth daily.   omeprazole 20 MG capsule Commonly known as: PRILOSEC Take 20 mg by mouth 2 (two) times daily before a meal.   pramipexole 1.5 MG tablet Commonly known as: MIRAPEX Take 1.5 mg by mouth at bedtime.   PROBIOTIC DAILY PO Take 1 tablet by mouth daily.   simvastatin 10 MG tablet Commonly known as: ZOCOR Take 10 mg by mouth daily.   Synthroid 137 MCG tablet Generic drug: levothyroxine Take 137 mcg by mouth daily.   Turmeric Curcumin 500 MG Caps Take 1 capsule by mouth daily.   valACYclovir 500 MG tablet Commonly known as: VALTREX Take 1,000 mg by mouth 2 (two) times daily. X 1 day for fever blisters   Vitamin D (Ergocalciferol) 50 MCG (2000 UT) Caps Take 1 capsule by mouth daily.       Allergies:  Allergies  Allergen Reactions  . Fentanyl Shortness Of Breath    Family History: Family History  Problem Relation Age of Onset  . Diabetes Sister   . Diabetes Maternal Aunt   . Diabetes Maternal Uncle   . Diabetes Maternal Aunt     Social History:  reports that he has never smoked. He has never used smokeless tobacco. He reports that he does not drink alcohol or use drugs.  ROS: UROLOGY Frequent Urination?: No Hard to postpone urination?: No Burning/pain with urination?: No Get up at night to urinate?: No Leakage of urine?: No Urine stream starts and stops?: No Trouble starting stream?: No Do you have to strain to urinate?: No Urinary tract infection?: No Sexually transmitted disease?: No Injury to kidneys or bladder?: No Painful intercourse?: No Weak stream?: No Erection problems?: Yes Penile pain?: Yes  Gastrointestinal Nausea?: No Vomiting?: No Indigestion/heartburn?: No Diarrhea?: No  Constipation?: No  Constitutional Fever: No Night sweats?: No Weight loss?: No Fatigue?: No  Skin Skin rash/lesions?: No Itching?: No  Eyes Blurred vision?: No Double vision?: No  Ears/Nose/Throat Sore throat?: No Sinus problems?: No  Hematologic/Lymphatic Swollen glands?: No Easy bruising?: No  Cardiovascular Leg swelling?: No Chest pain?: No  Respiratory Cough?: No Shortness of breath?: No  Endocrine Excessive thirst?: No  Musculoskeletal Back pain?: No Joint pain?: No  Neurological Headaches?: No Dizziness?: No  Psychologic Depression?: No Anxiety?: No  Physical Exam: BP (!) 158/88  Pulse 82   Ht 5\' 8"  (1.727 m)   Wt 202 lb (91.6 kg)   BMI 30.71 kg/m   Constitutional:  Alert and oriented, No acute distress. HEENT: [place] AT, moist mucus membranes.  Trachea midline, no masses. Cardiovascular: No clubbing, cyanosis, or edema. Respiratory: Normal respiratory effort, no increased work of breathing. GI: Abdomen is soft, nontender, nondistended, no abdominal masses, no palpable hernias, mild abdominal obesity GU: Notable suprapubic fat pad.  Penis is mildly buried but visible.  Circumcised phallus with orthotopic meatus without discharge.  Bilateral descended testicles, nontender, no masses.  Normal cord.  Patient notably not wearing underwear. Skin: No rashes, bruises or suspicious lesions. Neurologic: Grossly intact, no focal deficits, moving all 4 extremities. Psychiatric: Normal mood and affect.  Laboratory Data: Hbg A1c 5.5 on 07/2017   Urinalysis UA today unremarkable  Pertinent Imaging: n/a  Assessment & Plan:    1. Erectile dysfunction due to diabetes mellitus ([place]) Longstanding erectile dysfunction previously responsive to PDE 5 inhibitors he does not try this medication recently.  We discussed alternative treatments for erectile dysfunction today including intracavernosal injections, vacuum erection device and penile prosthesis at  length.  We discussed our protocol for intracavernosal injections which he may be interested in the let us know if he like to pursue this in the future.  We discussed then may be helpful to assess whether or not hypogonadism is playing a role in his sexual dysfunction.  He is agreeable with checking an early a.m. testosterone to rule this out as a contributing factor.  - Testosterone; Future  2. Acquired buried penis Extremely concerned about buried penis after recent weight gain.  We discussed that this is secondary to increase in suprapubic fat pad as well as likely penile atrophy.  Treatment options for this are relatively limited and primarily include weight loss.  Surgical management of acquired buried penis is also an option but requires extensive surgical intervention, offered referral to [facility] but declined.  3. Testicular pain Chronic testicular pain.  I have strongly recommended scrotal support.  His exam today is benign.  He does not wear underwear and I have strongly suggested that he wear this for scrotal support as well as NSAIDs.  No need for scrotal imaging at this time.  Will call with labs and f/u plan  Hollice Espy, MD  [facility] [address], [place], [place] [address] [phone]

## 2018-12-24 ENCOUNTER — Other Ambulatory Visit
Admission: RE | Admit: 2018-12-24 | Discharge: 2018-12-24 | Disposition: A | Discharge status: Home / Self Care | Payer: Medicare HMO | Attending: Urology | Admitting: Urology

## 2018-12-24 DIAGNOSIS — N521 Erectile dysfunction due to diseases classified elsewhere: Secondary | ICD-10-CM | POA: Insufficient documentation

## 2018-12-24 DIAGNOSIS — E1169 Type 2 diabetes mellitus with other specified complication: Secondary | ICD-10-CM | POA: Insufficient documentation

## 2018-12-25 LAB — TESTOSTERONE: Testosterone: 379 ng/dL (ref 264–916)

## 2018-12-28 ENCOUNTER — Telehealth: Payer: Self-pay | Admitting: *Deleted

## 2018-12-28 NOTE — Telephone Encounter (Addendum)
Left VM with results, asked to call back with any questions.    ----- Message from Hollice Espy, MD sent at 12/28/2018  8:50 AM EDT ----- Testosterone is normal.    Hollice Espy, MD

## 2019-02-18 ENCOUNTER — Encounter: Payer: Self-pay | Admitting: Urology

## 2019-02-22 ENCOUNTER — Other Ambulatory Visit: Payer: Self-pay

## 2019-02-22 DIAGNOSIS — E1169 Type 2 diabetes mellitus with other specified complication: Secondary | ICD-10-CM

## 2019-06-14 ENCOUNTER — Other Ambulatory Visit: Payer: Self-pay | Admitting: Neurology

## 2019-06-14 DIAGNOSIS — M62838 Other muscle spasm: Secondary | ICD-10-CM

## 2019-06-14 DIAGNOSIS — R109 Unspecified abdominal pain: Secondary | ICD-10-CM

## 2019-06-28 ENCOUNTER — Ambulatory Visit
Admission: RE | Admit: 2019-06-28 | Discharge: 2019-06-28 | Disposition: A | Discharge status: Home / Self Care | Payer: Medicare HMO | Source: Ambulatory Visit | Attending: Neurology | Admitting: Neurology

## 2019-06-28 ENCOUNTER — Other Ambulatory Visit: Payer: Self-pay

## 2019-06-28 DIAGNOSIS — R109 Unspecified abdominal pain: Secondary | ICD-10-CM | POA: Insufficient documentation

## 2019-06-28 DIAGNOSIS — M62838 Other muscle spasm: Secondary | ICD-10-CM

## 2019-06-28 LAB — POCT I-STAT CREATININE: Creatinine, Ser: 0.9 mg/dL (ref 0.61–1.24)

## 2019-06-28 IMAGING — MR MR THORACIC SPINE WO/W CM
6 of 8 series · 29 of 48 positions shown · IV contrast (gadavist)
Comparison: None.
COMPARISON: None.

Addendum:
CLINICAL DATA: Muscle spasms and involuntary movements.

EXAM:
MRI HEAD WITHOUT AND WITH CONTRAST
MRI CERVICAL SPINE WITHOUT AND WITH CONTRAST
MRI THORACIC SPINE WITHOUT AND WITH CONTRAST
TECHNIQUE: Multiplanar, multiecho pulse sequences of the brain and surrounding
structures, cervical spine and thoracic spine were obtained without
and with intravenous contrast.
CONTRAST:  9mL GADAVIST GADOBUTROL 1 MMOL/ML IV SOLN

[Series 9: T2 · sagittal · 3.0mm · 1.06mm/px · 4 of 19 slices shown (1 of 2)]
[im 1/19]
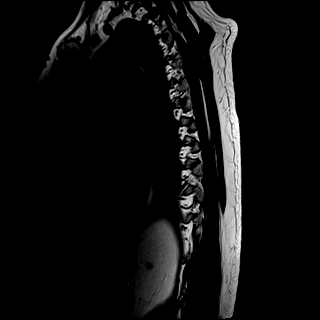
[im 7/19]
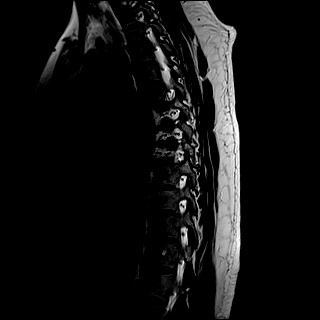
[im 13/19]
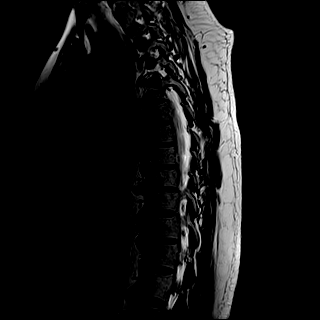
[im 19/19]
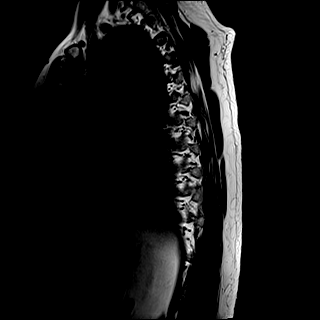

[Series 10: T1 · sagittal · 3.0mm · 1.06mm/px · 5 of 19 slices shown (1 of 2)]
[im 1/19]
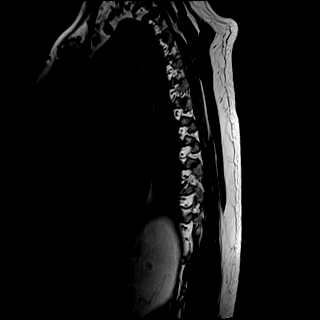
[im 5/19]
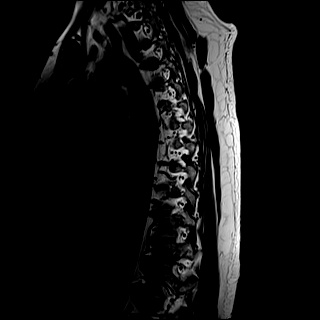
[im 10/19]
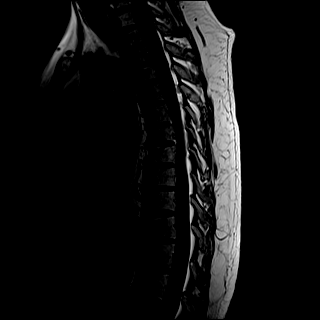
[im 14/19]
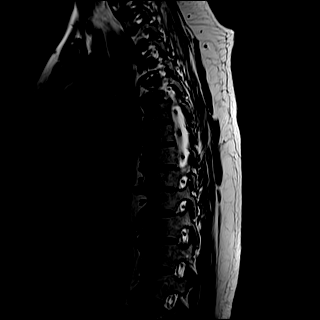
[im 19/19]
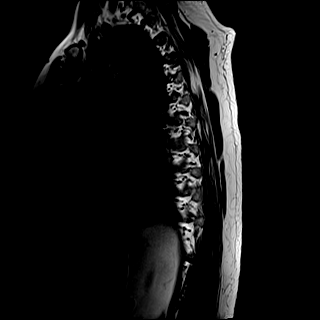

[Series 11: STIR · sagittal · 3.0mm · 0.53mm/px · 4 of 19 slices shown]
[im 1/19]
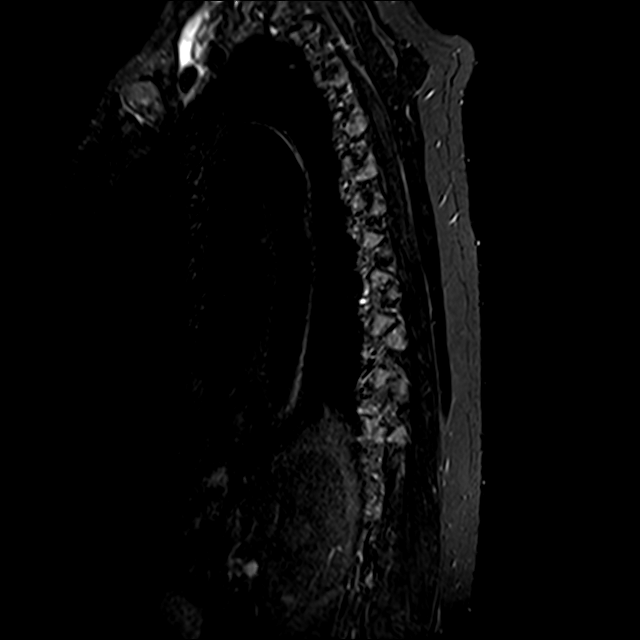
[im 5/19]
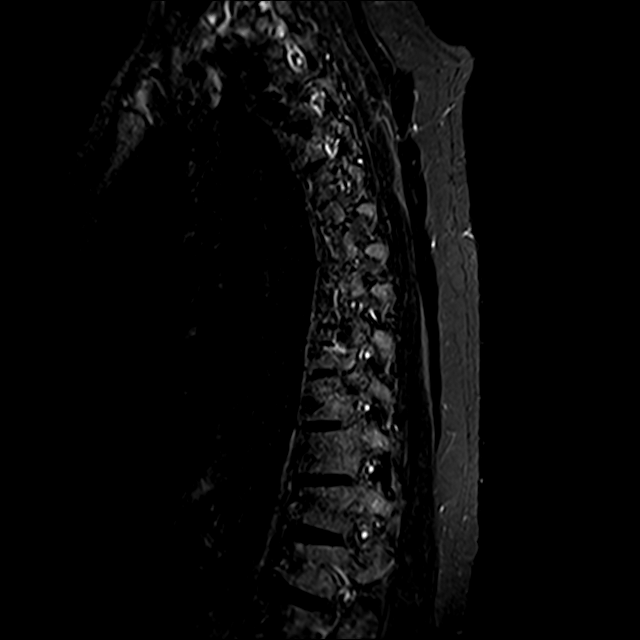
[im 10/19]
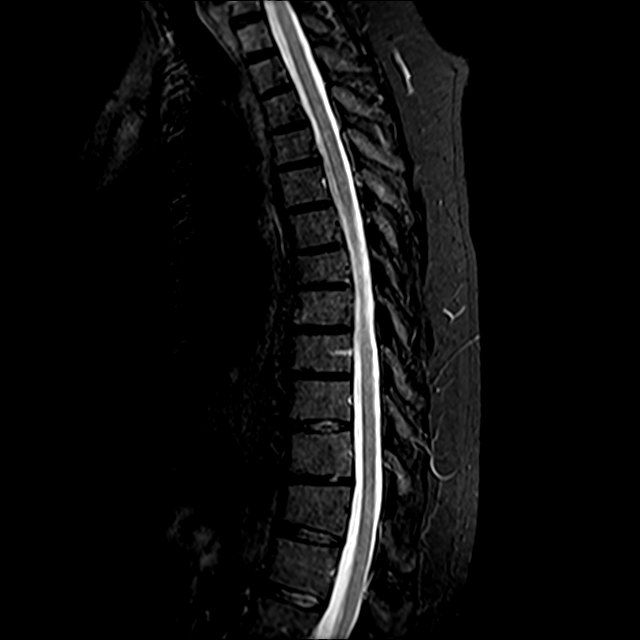
[im 14/19]
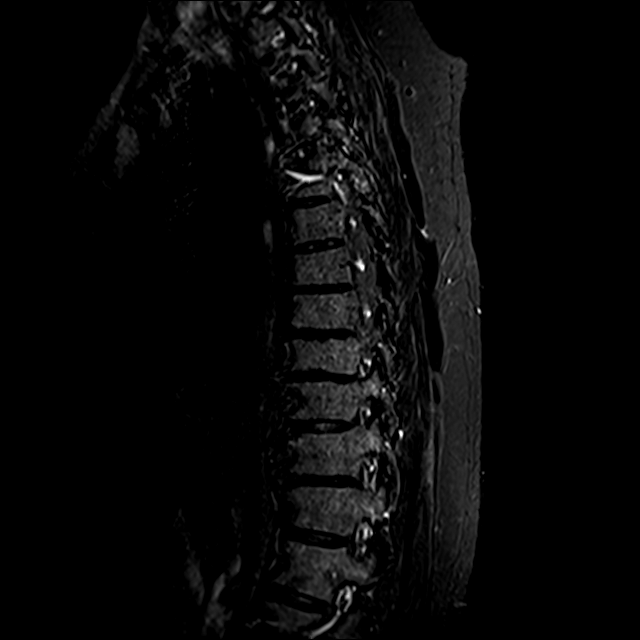

[Series 12: T2 · axial · 4.0mm · 0.59mm/px · z∈[-477,-232]mm · 9 of 39 slices shown (2 of 2)]
[im 1/39]
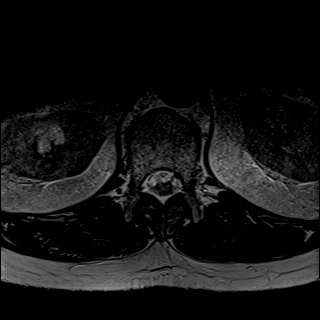
[im 5/39]
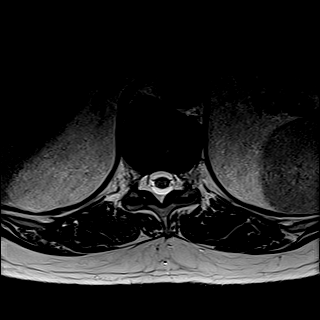
[im 10/39]
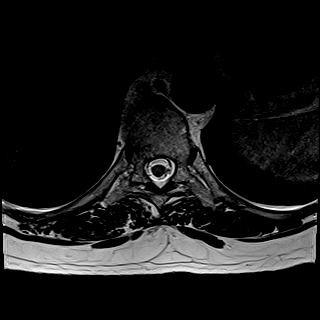
[im 15/39]
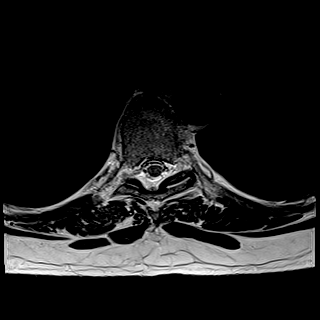
[im 20/39]
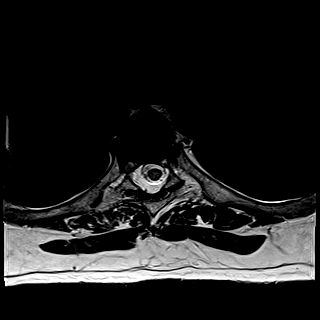
[im 24/39]
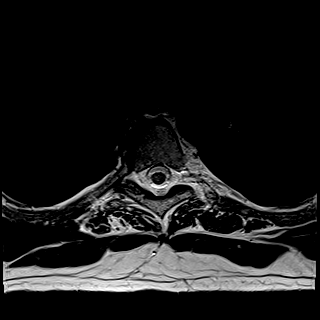
[im 29/39]
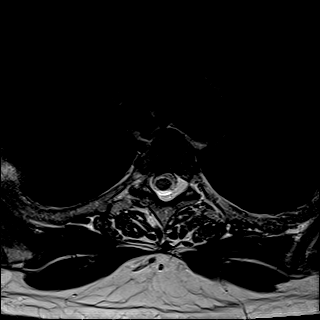
[im 34/39]
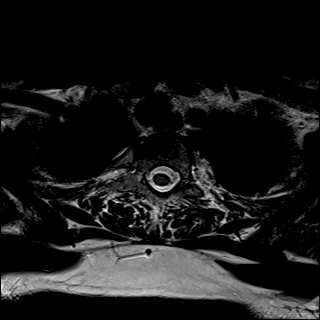
[im 39/39]
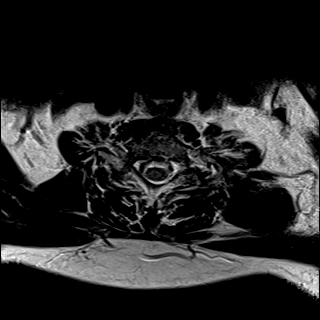

[Series 15: T1 · sagittal · 5.0mm · 1.88mm/px · 2 of 9 slices shown (2 of 2)]
[im 1/9]
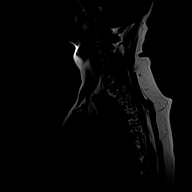
[im 9/9]
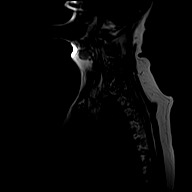

[Series 17: T1 fat-sat post-contrast · sagittal · 3.0mm · 1.33mm/px · 5 of 19 slices shown]
[im 1/19]
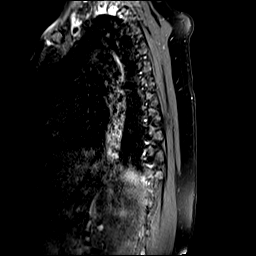
[im 5/19]
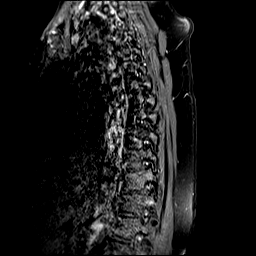
[im 10/19]
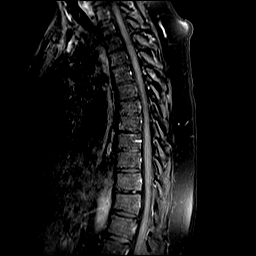
[im 14/19]
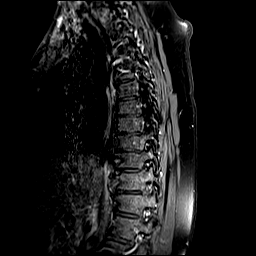
[im 19/19]
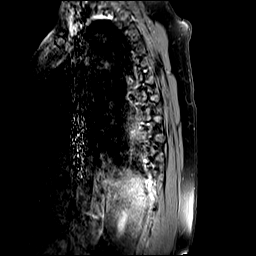

[29 of 48 positions shown; findings below may reference images not displayed]

FINDINGS: MRI HEAD FINDINGS

Brain: Substantial susceptibility effects, likely from dental
hardware. There is no acute infarct, acute hemorrhage or extra-axial
collection. The white matter signal is normal for the patient's age.
There is a small old infarct of the right occipital lobe.
Blood-sensitive sequences show no chronic microhemorrhage or
superficial siderosis. The midline structures are normal. There is
no abnormal contrast enhancement.

Vascular: Normal flow voids.

Skull and upper cervical spine: Normal marrow signal.

Sinuses/Orbits: Negative.

Other: None.

MRI CERVICAL SPINE FINDINGS

Alignment: Reversal of normal cervical lordosis. Grade 1
retrolisthesis at C5-6 and C6-7.

Vertebrae: No fracture, evidence of discitis, or bone lesion.

Cord: Normal signal and morphology.

Posterior Fossa, vertebral arteries, paraspinal tissues: Negative.

Disc levels:

C1-C2: Mild hypertrophy

C2-C3: Bilateral facet hypertrophy with mild bilateral foraminal
stenosis. No disc herniation or spinal canal stenosis.

C3-C4: Intermediate sized disc osteophyte complex with moderate
facet hypertrophy. No spinal canal stenosis. Moderate bilateral
neural foraminal stenosis.

C4-C5: Intermediate sized disc osteophyte complex mild facet
hypertrophy. No spinal canal stenosis. Moderate bilateral neural
foraminal stenosis.

C5-C6: Large disc osteophyte complex severe right and moderate left
foraminal stenosis. No central spinal canal stenosis.

C6-C7: Large disc osteophyte complex with mild spinal canal
stenosis. Moderate right and severe left neural foraminal stenosis.

C7-T1: Normal disc space and facets. No spinal canal or
neuroforaminal stenosis.

No abnormal contrast enhancement.

MRI THORACIC SPINE FINDINGS

Alignment: Physiologic.

Vertebrae: No fracture, evidence of discitis, or bone lesion.

Cord: Normal signal and morphology.

Paraspinal tissues: Negative.

Disc levels: No disc herniation. No spinal canal or neural foraminal
stenosis.

No abnormal contrast enhancement.
IMPRESSION: 1. Normal MRI of the brain for age.
2. Cervical degenerative disc disease with multilevel
moderate-to-severe neural foraminal stenosis.
3. Mild C6-7 cervical spinal canal stenosis.
4. Normal MRI of the thoracic spine.

ADDENDUM:
There is an old, small right occipital infarct.

*** End of Addendum ***
FINDINGS: MRI HEAD FINDINGS

Brain: Substantial susceptibility effects, likely from dental
hardware. There is no acute infarct, acute hemorrhage or extra-axial
collection. The white matter signal is normal for the patient's age.
There is a small old infarct of the right occipital lobe.
Blood-sensitive sequences show no chronic microhemorrhage or
superficial siderosis. The midline structures are normal. There is
no abnormal contrast enhancement.

Vascular: Normal flow voids.

Skull and upper cervical spine: Normal marrow signal.

Sinuses/Orbits: Negative.

Other: None.

MRI CERVICAL SPINE FINDINGS

Alignment: Reversal of normal cervical lordosis. Grade 1
retrolisthesis at C5-6 and C6-7.

Vertebrae: No fracture, evidence of discitis, or bone lesion.

Cord: Normal signal and morphology.

Posterior Fossa, vertebral arteries, paraspinal tissues: Negative.

Disc levels:

C1-C2: Mild hypertrophy

C2-C3: Bilateral facet hypertrophy with mild bilateral foraminal
stenosis. No disc herniation or spinal canal stenosis.

C3-C4: Intermediate sized disc osteophyte complex with moderate
facet hypertrophy. No spinal canal stenosis. Moderate bilateral
neural foraminal stenosis.

C4-C5: Intermediate sized disc osteophyte complex mild facet
hypertrophy. No spinal canal stenosis. Moderate bilateral neural
foraminal stenosis.

C5-C6: Large disc osteophyte complex severe right and moderate left
foraminal stenosis. No central spinal canal stenosis.

C6-C7: Large disc osteophyte complex with mild spinal canal
stenosis. Moderate right and severe left neural foraminal stenosis.

C7-T1: Normal disc space and facets. No spinal canal or
neuroforaminal stenosis.

No abnormal contrast enhancement.

MRI THORACIC SPINE FINDINGS

Alignment: Physiologic.

Vertebrae: No fracture, evidence of discitis, or bone lesion.

Cord: Normal signal and morphology.

Paraspinal tissues: Negative.

Disc levels: No disc herniation. No spinal canal or neural foraminal
stenosis.

No abnormal contrast enhancement.
IMPRESSION: 1. Normal MRI of the brain for age.
2. Cervical degenerative disc disease with multilevel
moderate-to-severe neural foraminal stenosis.
3. Mild C6-7 cervical spinal canal stenosis.
4. Normal MRI of the thoracic spine.

## 2019-06-28 IMAGING — MR MR HEAD WO/W CM
14 series · 42 of 48 positions shown · IV contrast (gadavist)
Comparison: None.
COMPARISON: None.

Addendum:
CLINICAL DATA: Muscle spasms and involuntary movements.

EXAM:
MRI HEAD WITHOUT AND WITH CONTRAST
MRI CERVICAL SPINE WITHOUT AND WITH CONTRAST
MRI THORACIC SPINE WITHOUT AND WITH CONTRAST
TECHNIQUE: Multiplanar, multiecho pulse sequences of the brain and surrounding
structures, cervical spine and thoracic spine were obtained without
and with intravenous contrast.
CONTRAST:  9mL GADAVIST GADOBUTROL 1 MMOL/ML IV SOLN

[Series 9: ax dwi_tracew · axial · 3.0mm · 0.60mm/px · z∈[-94,+64]mm · 2 of 54 slices shown]
[im 1/54]
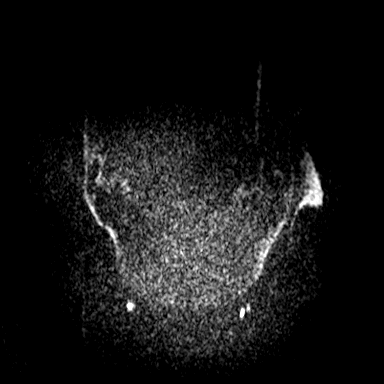
[im 54/54]
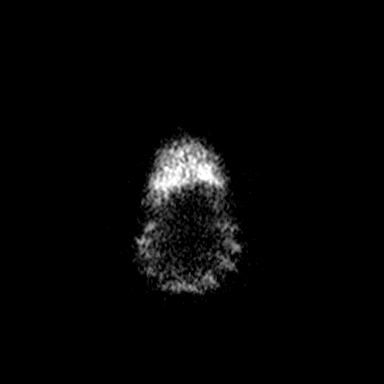

[Series 10: ax dwi_adc · axial · 3.0mm · 0.60mm/px · z∈[-94,+64]mm · 3 of 55 slices shown]
[im 1/55]
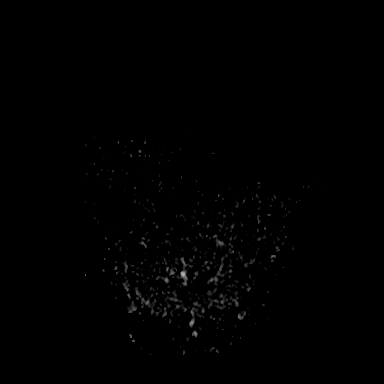
[im 28/55]
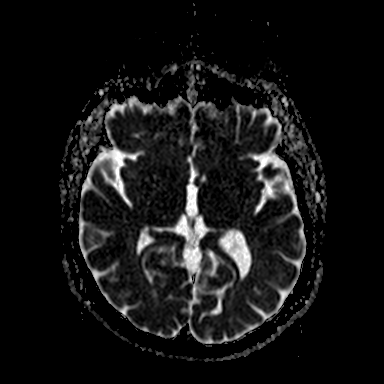
[im 55/55]
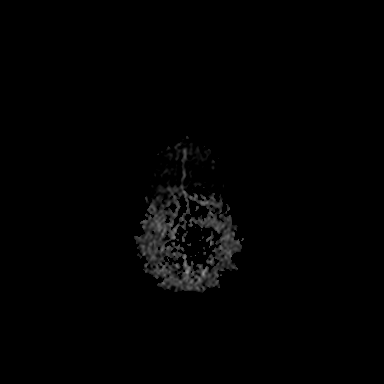

[Series 11: cor dwi_tracew · coronal · 5.0mm · 0.68mm/px · 2 of 40 slices shown]
[im 1/40]
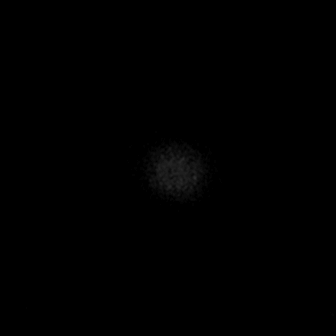
[im 40/40]
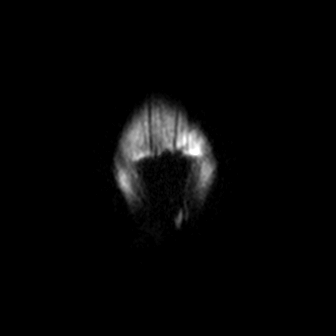

[Series 12: cor dwi_adc · coronal · 5.0mm · 0.68mm/px · 2 of 40 slices shown]
[im 1/40]
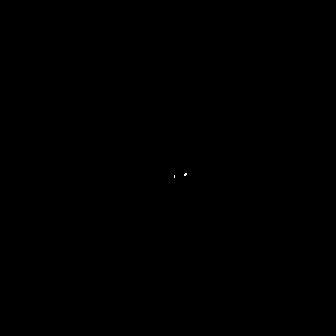
[im 40/40]
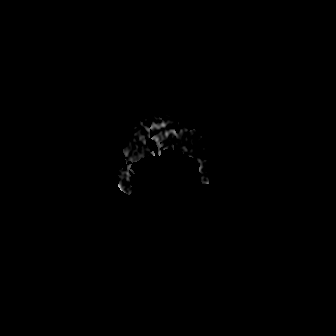

[Series 13: T1 · sagittal · 5.0mm · 0.62mm/px · 1 of 25 slices shown (1 of 2)]
[im 1/25]
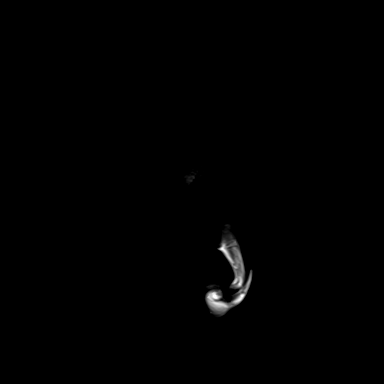

[Series 14: T2 · axial · 5.0mm · 0.53mm/px · z∈[-89,+69]mm · 2 of 28 slices shown (1 of 2)]
[im 1/28]
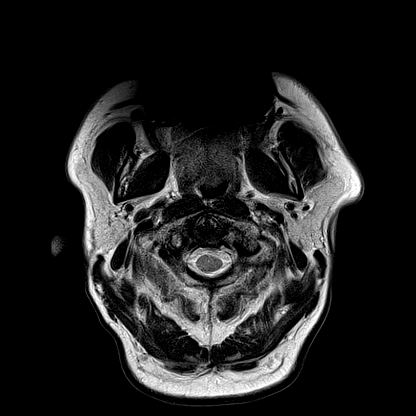
[im 28/28]
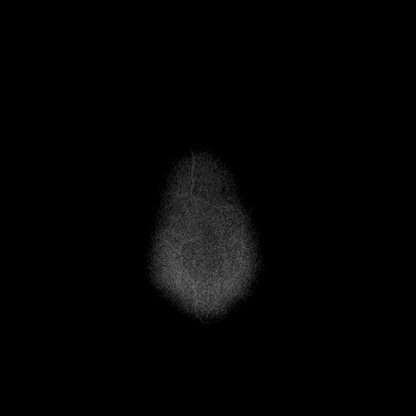

[Series 15: mag_images · axial · 3.0mm · 0.90mm/px · z∈[-95,+78]mm · 3 of 60 slices shown]
[im 1/60]
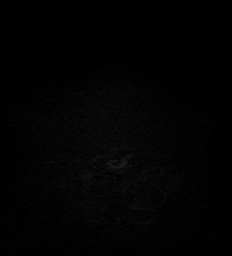
[im 30/60]
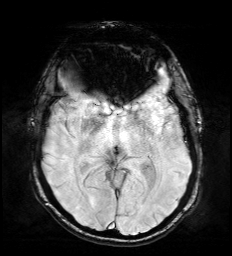
[im 60/60]
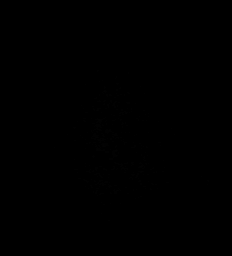

[Series 16: pha_images · axial · 3.0mm · 0.90mm/px · z∈[-92,+78]mm · 3 of 57 slices shown]
[im 1/57]
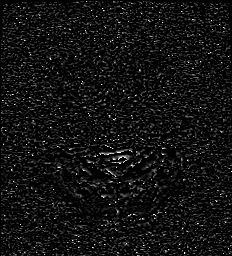
[im 29/57]
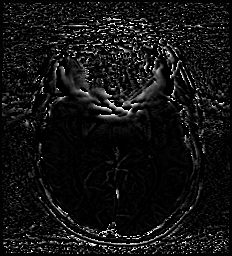
[im 57/57]
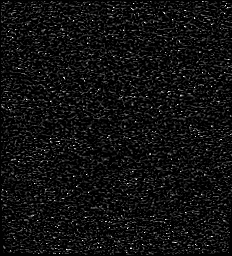

[Series 17: swi_images · axial · 3.0mm · 0.90mm/px · 1 of 60 slices shown]
[im 1/60]
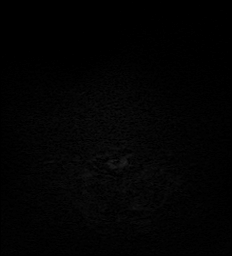

[Series 19: FLAIR · axial · 3.0mm · 0.53mm/px · z∈[-89,+69]mm · 3 of 55 slices shown]
[im 1/55]
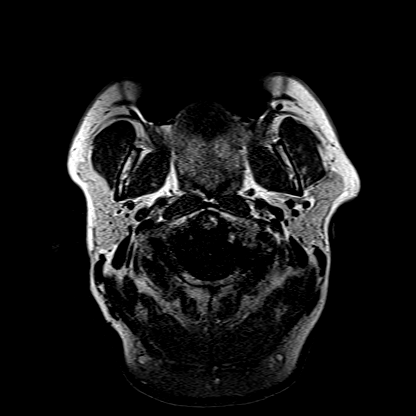
[im 28/55]
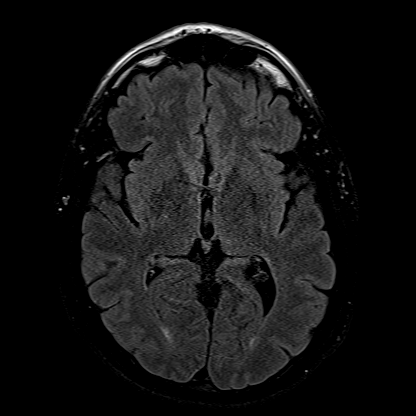
[im 55/55]
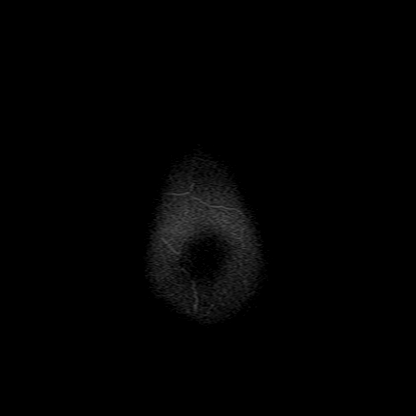

[Series 20: T1 · axial · 1.0mm · 0.98mm/px · z∈[-97,+74]mm · 8 of 176 slices shown (2 of 2)]
[im 1/176]
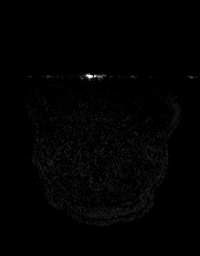
[im 20/176]
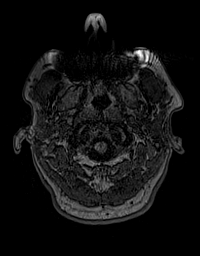
[im 59/176]
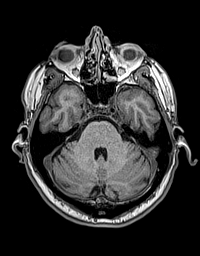
[im 78/176]
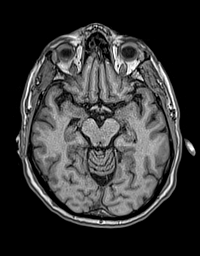
[im 98/176]
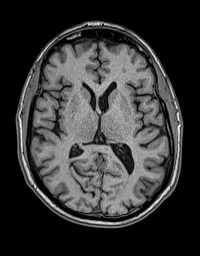
[im 117/176]
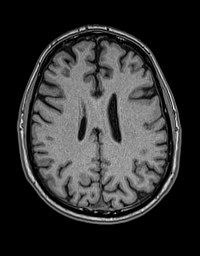
[im 156/176]
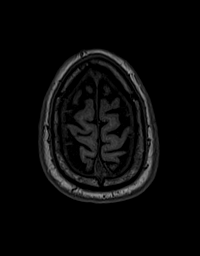
[im 176/176]
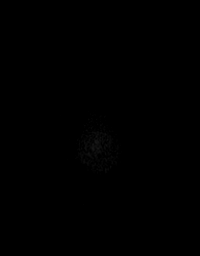

[Series 21: T2 · coronal · 5.0mm · 0.57mm/px · 2 of 29 slices shown (2 of 2)]
[im 1/29]
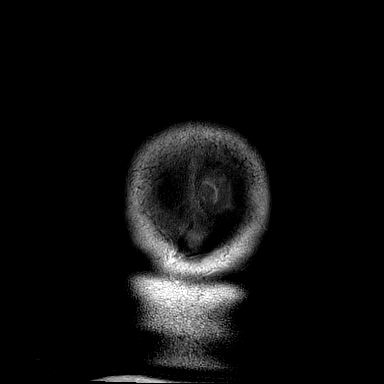
[im 29/29]
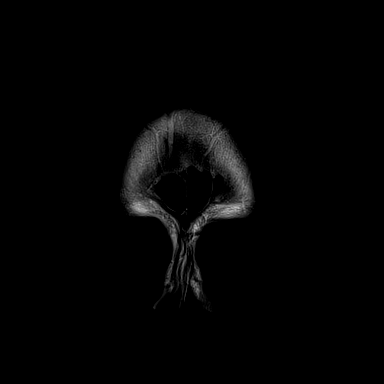

[Series 22: T1 post-contrast · axial · 1.0mm · 0.98mm/px · z∈[-97,+74]mm · 8 of 176 slices shown (1 of 2)]
[im 1/176]
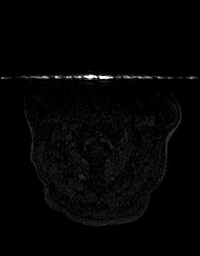
[im 20/176]
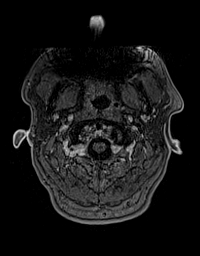
[im 59/176]
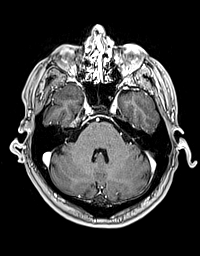
[im 78/176]
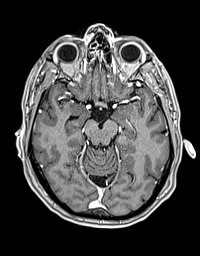
[im 98/176]
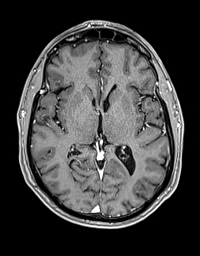
[im 117/176]
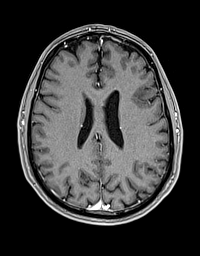
[im 156/176]
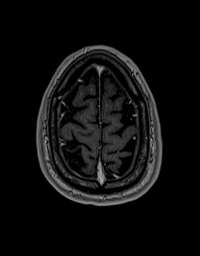
[im 176/176]
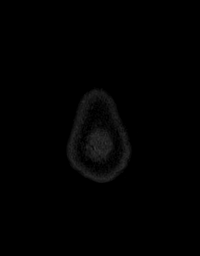

[Series 23: T1 post-contrast · coronal · 5.0mm · 0.57mm/px · 2 of 29 slices shown (2 of 2)]
[im 1/29]
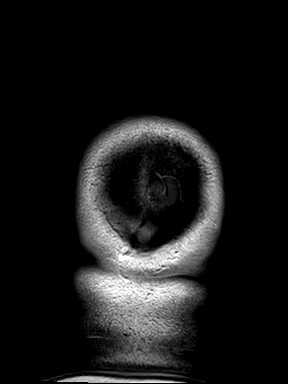
[im 29/29]
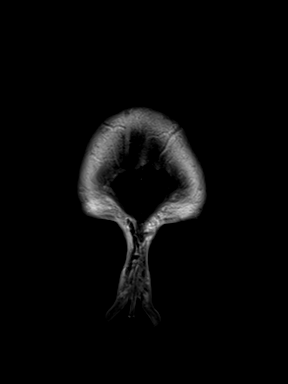

[42 of 48 positions shown; findings below may reference images not displayed]

FINDINGS: MRI HEAD FINDINGS

Brain: Substantial susceptibility effects, likely from dental
hardware. There is no acute infarct, acute hemorrhage or extra-axial
collection. The white matter signal is normal for the patient's age.
There is a small old infarct of the right occipital lobe.
Blood-sensitive sequences show no chronic microhemorrhage or
superficial siderosis. The midline structures are normal. There is
no abnormal contrast enhancement.

Vascular: Normal flow voids.

Skull and upper cervical spine: Normal marrow signal.

Sinuses/Orbits: Negative.

Other: None.

MRI CERVICAL SPINE FINDINGS

Alignment: Reversal of normal cervical lordosis. Grade 1
retrolisthesis at C5-6 and C6-7.

Vertebrae: No fracture, evidence of discitis, or bone lesion.

Cord: Normal signal and morphology.

Posterior Fossa, vertebral arteries, paraspinal tissues: Negative.

Disc levels:

C1-C2: Mild hypertrophy

C2-C3: Bilateral facet hypertrophy with mild bilateral foraminal
stenosis. No disc herniation or spinal canal stenosis.

C3-C4: Intermediate sized disc osteophyte complex with moderate
facet hypertrophy. No spinal canal stenosis. Moderate bilateral
neural foraminal stenosis.

C4-C5: Intermediate sized disc osteophyte complex mild facet
hypertrophy. No spinal canal stenosis. Moderate bilateral neural
foraminal stenosis.

C5-C6: Large disc osteophyte complex severe right and moderate left
foraminal stenosis. No central spinal canal stenosis.

C6-C7: Large disc osteophyte complex with mild spinal canal
stenosis. Moderate right and severe left neural foraminal stenosis.

C7-T1: Normal disc space and facets. No spinal canal or
neuroforaminal stenosis.

No abnormal contrast enhancement.

MRI THORACIC SPINE FINDINGS

Alignment: Physiologic.

Vertebrae: No fracture, evidence of discitis, or bone lesion.

Cord: Normal signal and morphology.

Paraspinal tissues: Negative.

Disc levels: No disc herniation. No spinal canal or neural foraminal
stenosis.

No abnormal contrast enhancement.
IMPRESSION: 1. Normal MRI of the brain for age.
2. Cervical degenerative disc disease with multilevel
moderate-to-severe neural foraminal stenosis.
3. Mild C6-7 cervical spinal canal stenosis.
4. Normal MRI of the thoracic spine.

ADDENDUM:
There is an old, small right occipital infarct.

*** End of Addendum ***
FINDINGS: MRI HEAD FINDINGS

Brain: Substantial susceptibility effects, likely from dental
hardware. There is no acute infarct, acute hemorrhage or extra-axial
collection. The white matter signal is normal for the patient's age.
There is a small old infarct of the right occipital lobe.
Blood-sensitive sequences show no chronic microhemorrhage or
superficial siderosis. The midline structures are normal. There is
no abnormal contrast enhancement.

Vascular: Normal flow voids.

Skull and upper cervical spine: Normal marrow signal.

Sinuses/Orbits: Negative.

Other: None.

MRI CERVICAL SPINE FINDINGS

Alignment: Reversal of normal cervical lordosis. Grade 1
retrolisthesis at C5-6 and C6-7.

Vertebrae: No fracture, evidence of discitis, or bone lesion.

Cord: Normal signal and morphology.

Posterior Fossa, vertebral arteries, paraspinal tissues: Negative.

Disc levels:

C1-C2: Mild hypertrophy

C2-C3: Bilateral facet hypertrophy with mild bilateral foraminal
stenosis. No disc herniation or spinal canal stenosis.

C3-C4: Intermediate sized disc osteophyte complex with moderate
facet hypertrophy. No spinal canal stenosis. Moderate bilateral
neural foraminal stenosis.

C4-C5: Intermediate sized disc osteophyte complex mild facet
hypertrophy. No spinal canal stenosis. Moderate bilateral neural
foraminal stenosis.

C5-C6: Large disc osteophyte complex severe right and moderate left
foraminal stenosis. No central spinal canal stenosis.

C6-C7: Large disc osteophyte complex with mild spinal canal
stenosis. Moderate right and severe left neural foraminal stenosis.

C7-T1: Normal disc space and facets. No spinal canal or
neuroforaminal stenosis.

No abnormal contrast enhancement.

MRI THORACIC SPINE FINDINGS

Alignment: Physiologic.

Vertebrae: No fracture, evidence of discitis, or bone lesion.

Cord: Normal signal and morphology.

Paraspinal tissues: Negative.

Disc levels: No disc herniation. No spinal canal or neural foraminal
stenosis.

No abnormal contrast enhancement.
IMPRESSION: 1. Normal MRI of the brain for age.
2. Cervical degenerative disc disease with multilevel
moderate-to-severe neural foraminal stenosis.
3. Mild C6-7 cervical spinal canal stenosis.
4. Normal MRI of the thoracic spine.

## 2019-06-28 IMAGING — MR MR CERVICAL SPINE WO/W CM
6 of 9 series · 27 of 48 positions shown · IV contrast (gadavist)
Comparison: None.
COMPARISON: None.

Addendum:
CLINICAL DATA: Muscle spasms and involuntary movements.

EXAM:
MRI HEAD WITHOUT AND WITH CONTRAST
MRI CERVICAL SPINE WITHOUT AND WITH CONTRAST
MRI THORACIC SPINE WITHOUT AND WITH CONTRAST
TECHNIQUE: Multiplanar, multiecho pulse sequences of the brain and surrounding
structures, cervical spine and thoracic spine were obtained without
and with intravenous contrast.
CONTRAST:  9mL GADAVIST GADOBUTROL 1 MMOL/ML IV SOLN

[Series 9: T2 · sagittal · 3.0mm · 0.62mm/px · 3 of 15 slices shown (1 of 2)]
[im 1/15]
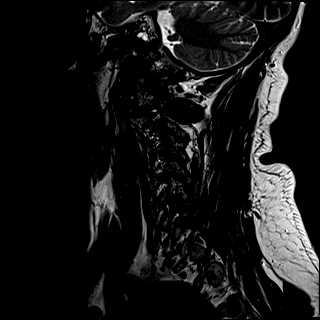
[im 8/15]
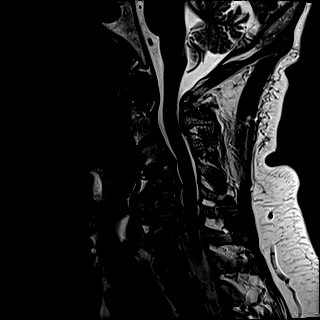
[im 15/15]
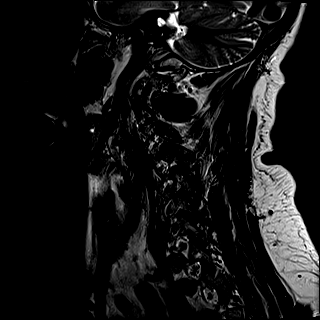

[Series 11: STIR · sagittal · 3.0mm · 0.62mm/px · 4 of 15 slices shown]
[im 1/15]
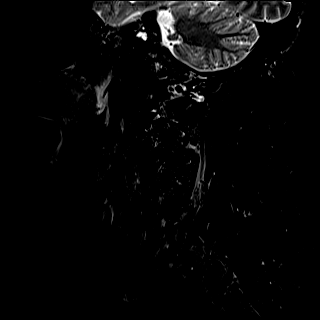
[im 5/15]
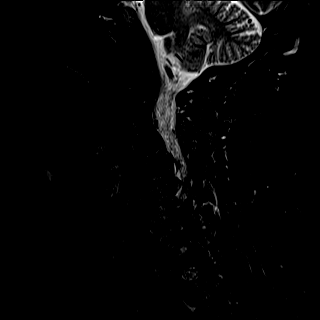
[im 10/15]
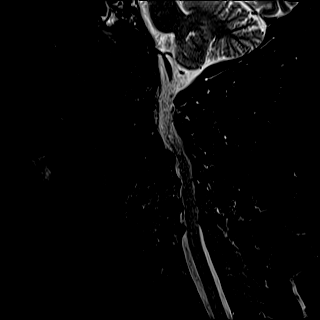
[im 15/15]
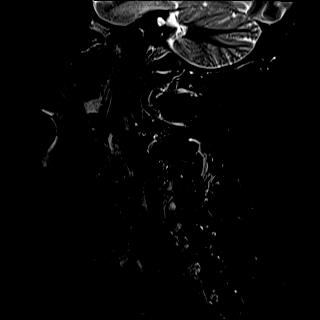

[Series 12: T2 · axial · 3.0mm · 0.70mm/px · z∈[-243,-147]mm · 8 of 29 slices shown (2 of 2)]
[im 1/29]
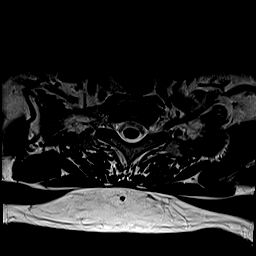
[im 5/29]
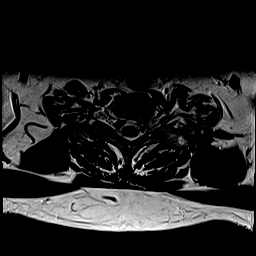
[im 9/29]
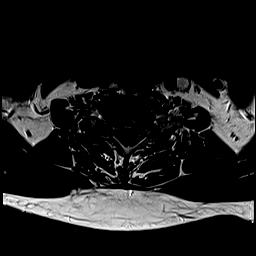
[im 13/29]
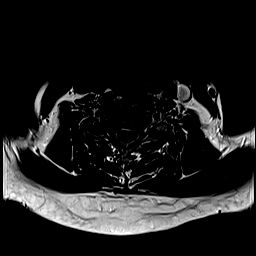
[im 17/29]
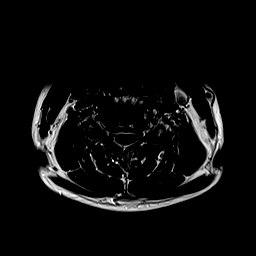
[im 21/29]
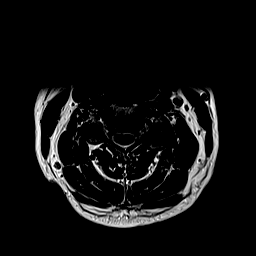
[im 25/29]
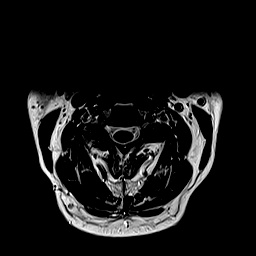
[im 29/29]
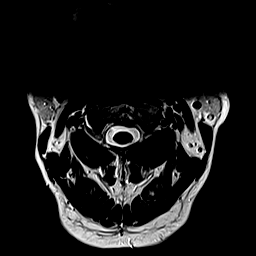

[Series 14: T1 · axial · non-contrast · 3.0mm · 0.35mm/px · z∈[-243,-147]mm · 8 of 29 slices shown (1 of 2)]
[im 1/29]
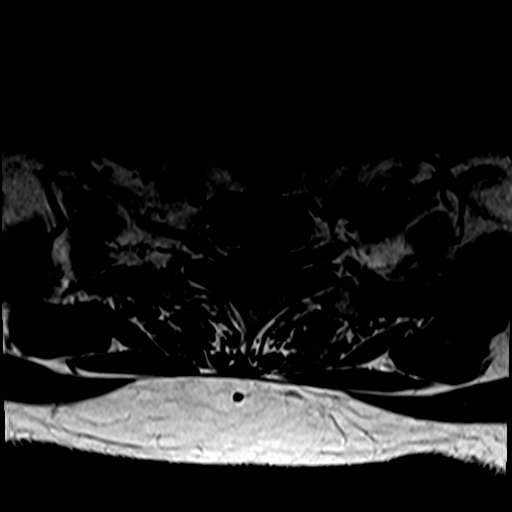
[im 5/29]
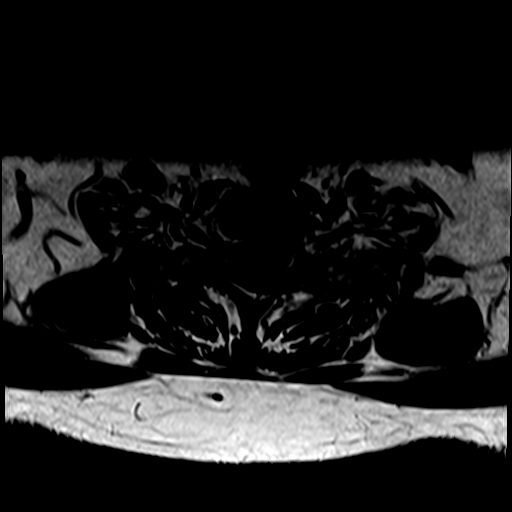
[im 9/29]
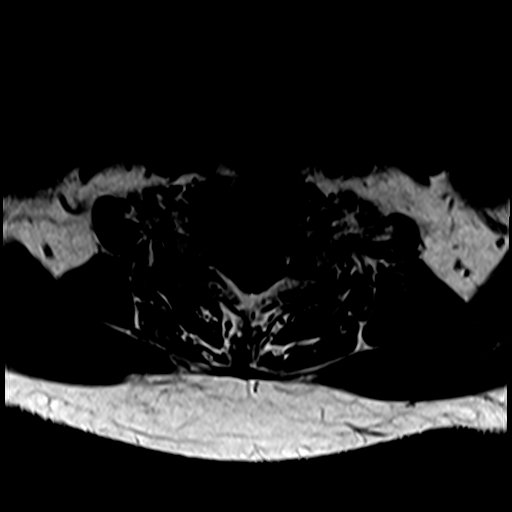
[im 13/29]
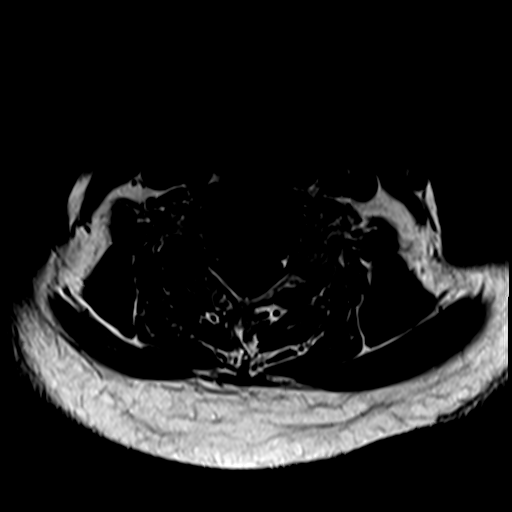
[im 17/29]
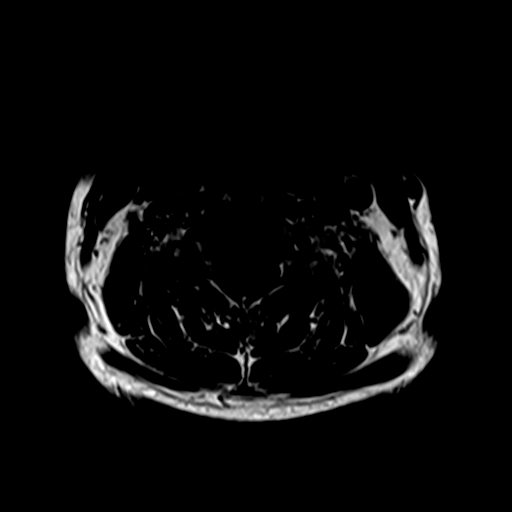
[im 21/29]
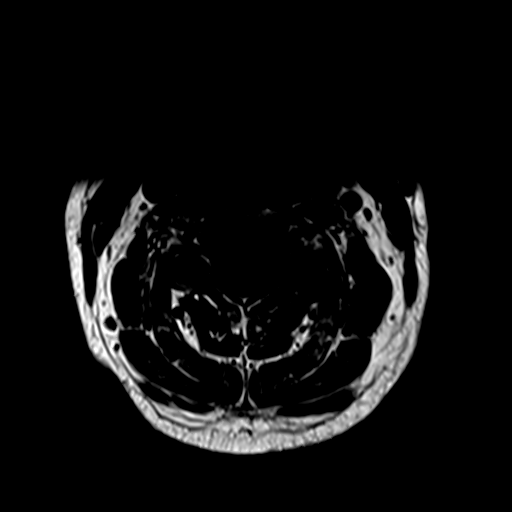
[im 25/29]
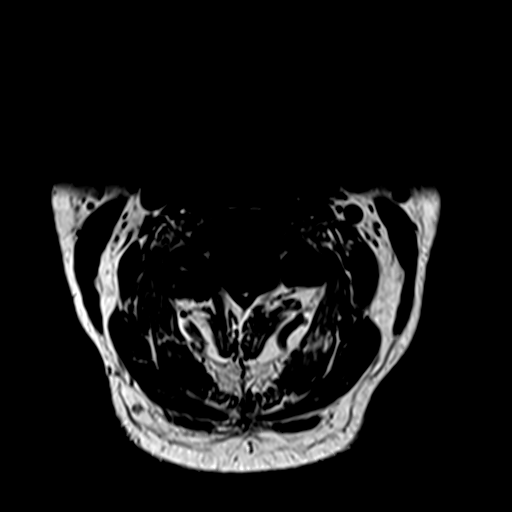
[im 29/29]
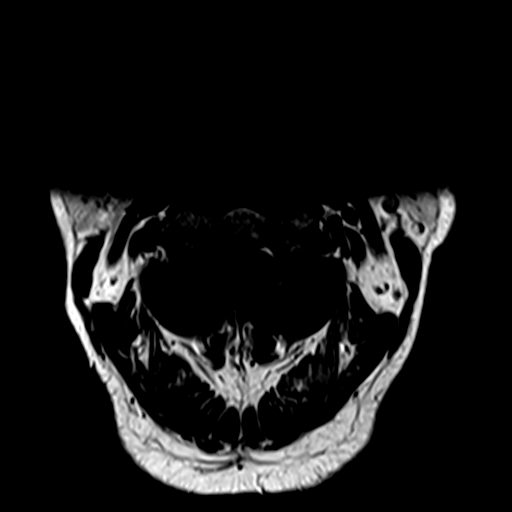

[Series 30: T1 · sagittal · 5.0mm · 1.88mm/px · 2 of 9 slices shown (2 of 2)]
[im 1/9]
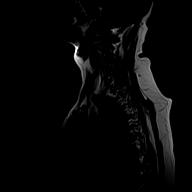
[im 9/9]
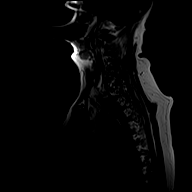

[Series 32: T1 post-contrast · axial · 3.0mm · 0.35mm/px · z∈[-243,-229]mm · 2 of 29 slices shown]
[im 1/29]
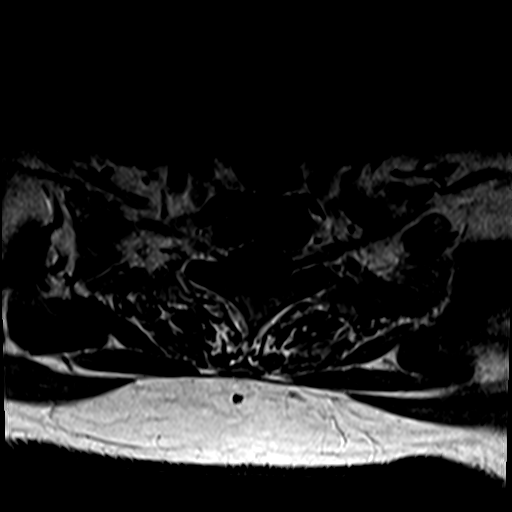
[im 5/29]
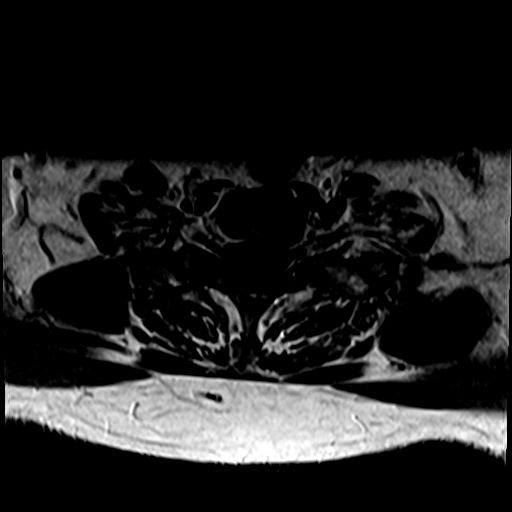

[27 of 48 positions shown; findings below may reference images not displayed]

FINDINGS: MRI HEAD FINDINGS

Brain: Substantial susceptibility effects, likely from dental
hardware. There is no acute infarct, acute hemorrhage or extra-axial
collection. The white matter signal is normal for the patient's age.
There is a small old infarct of the right occipital lobe.
Blood-sensitive sequences show no chronic microhemorrhage or
superficial siderosis. The midline structures are normal. There is
no abnormal contrast enhancement.

Vascular: Normal flow voids.

Skull and upper cervical spine: Normal marrow signal.

Sinuses/Orbits: Negative.

Other: None.

MRI CERVICAL SPINE FINDINGS

Alignment: Reversal of normal cervical lordosis. Grade 1
retrolisthesis at C5-6 and C6-7.

Vertebrae: No fracture, evidence of discitis, or bone lesion.

Cord: Normal signal and morphology.

Posterior Fossa, vertebral arteries, paraspinal tissues: Negative.

Disc levels:

C1-C2: Mild hypertrophy

C2-C3: Bilateral facet hypertrophy with mild bilateral foraminal
stenosis. No disc herniation or spinal canal stenosis.

C3-C4: Intermediate sized disc osteophyte complex with moderate
facet hypertrophy. No spinal canal stenosis. Moderate bilateral
neural foraminal stenosis.

C4-C5: Intermediate sized disc osteophyte complex mild facet
hypertrophy. No spinal canal stenosis. Moderate bilateral neural
foraminal stenosis.

C5-C6: Large disc osteophyte complex severe right and moderate left
foraminal stenosis. No central spinal canal stenosis.

C6-C7: Large disc osteophyte complex with mild spinal canal
stenosis. Moderate right and severe left neural foraminal stenosis.

C7-T1: Normal disc space and facets. No spinal canal or
neuroforaminal stenosis.

No abnormal contrast enhancement.

MRI THORACIC SPINE FINDINGS

Alignment: Physiologic.

Vertebrae: No fracture, evidence of discitis, or bone lesion.

Cord: Normal signal and morphology.

Paraspinal tissues: Negative.

Disc levels: No disc herniation. No spinal canal or neural foraminal
stenosis.

No abnormal contrast enhancement.
IMPRESSION: 1. Normal MRI of the brain for age.
2. Cervical degenerative disc disease with multilevel
moderate-to-severe neural foraminal stenosis.
3. Mild C6-7 cervical spinal canal stenosis.
4. Normal MRI of the thoracic spine.

ADDENDUM:
There is an old, small right occipital infarct.

*** End of Addendum ***
FINDINGS: MRI HEAD FINDINGS

Brain: Substantial susceptibility effects, likely from dental
hardware. There is no acute infarct, acute hemorrhage or extra-axial
collection. The white matter signal is normal for the patient's age.
There is a small old infarct of the right occipital lobe.
Blood-sensitive sequences show no chronic microhemorrhage or
superficial siderosis. The midline structures are normal. There is
no abnormal contrast enhancement.

Vascular: Normal flow voids.

Skull and upper cervical spine: Normal marrow signal.

Sinuses/Orbits: Negative.

Other: None.

MRI CERVICAL SPINE FINDINGS

Alignment: Reversal of normal cervical lordosis. Grade 1
retrolisthesis at C5-6 and C6-7.

Vertebrae: No fracture, evidence of discitis, or bone lesion.

Cord: Normal signal and morphology.

Posterior Fossa, vertebral arteries, paraspinal tissues: Negative.

Disc levels:

C1-C2: Mild hypertrophy

C2-C3: Bilateral facet hypertrophy with mild bilateral foraminal
stenosis. No disc herniation or spinal canal stenosis.

C3-C4: Intermediate sized disc osteophyte complex with moderate
facet hypertrophy. No spinal canal stenosis. Moderate bilateral
neural foraminal stenosis.

C4-C5: Intermediate sized disc osteophyte complex mild facet
hypertrophy. No spinal canal stenosis. Moderate bilateral neural
foraminal stenosis.

C5-C6: Large disc osteophyte complex severe right and moderate left
foraminal stenosis. No central spinal canal stenosis.

C6-C7: Large disc osteophyte complex with mild spinal canal
stenosis. Moderate right and severe left neural foraminal stenosis.

C7-T1: Normal disc space and facets. No spinal canal or
neuroforaminal stenosis.

No abnormal contrast enhancement.

MRI THORACIC SPINE FINDINGS

Alignment: Physiologic.

Vertebrae: No fracture, evidence of discitis, or bone lesion.

Cord: Normal signal and morphology.

Paraspinal tissues: Negative.

Disc levels: No disc herniation. No spinal canal or neural foraminal
stenosis.

No abnormal contrast enhancement.
IMPRESSION: 1. Normal MRI of the brain for age.
2. Cervical degenerative disc disease with multilevel
moderate-to-severe neural foraminal stenosis.
3. Mild C6-7 cervical spinal canal stenosis.
4. Normal MRI of the thoracic spine.

## 2019-06-28 MED ORDER — GADOBUTROL 1 MMOL/ML IV SOLN
9.0000 mL | Freq: Once | INTRAVENOUS | Status: AC | PRN
  Administered 2019-06-28: 9 mL via INTRAVENOUS

## 2019-08-19 ENCOUNTER — Encounter: Payer: Self-pay | Admitting: Emergency Medicine

## 2019-08-19 ENCOUNTER — Ambulatory Visit (INDEPENDENT_AMBULATORY_CARE_PROVIDER_SITE_OTHER): Payer: Medicare HMO

## 2019-08-19 ENCOUNTER — Ambulatory Visit
Admission: EM | Admit: 2019-08-19 | Discharge: 2019-08-19 | Disposition: A | Discharge status: Home / Self Care | Payer: Medicare HMO | Attending: Family Medicine | Admitting: Family Medicine

## 2019-08-19 ENCOUNTER — Other Ambulatory Visit: Payer: Self-pay

## 2019-08-19 DIAGNOSIS — M4310 Spondylolisthesis, site unspecified: Secondary | ICD-10-CM | POA: Diagnosis not present

## 2019-08-19 DIAGNOSIS — M545 Low back pain: Secondary | ICD-10-CM | POA: Diagnosis not present

## 2019-08-19 DIAGNOSIS — S39012A Strain of muscle, fascia and tendon of lower back, initial encounter: Secondary | ICD-10-CM | POA: Diagnosis not present

## 2019-08-19 IMAGING — CR DG LUMBAR SPINE COMPLETE 4+V
6 series · 6 of 6 positions shown · non-contrast
Comparison: None.

CLINICAL DATA: Back pain for 5 months

EXAM:
LUMBAR SPINE - COMPLETE 4+ VIEW

[l-spine ap]
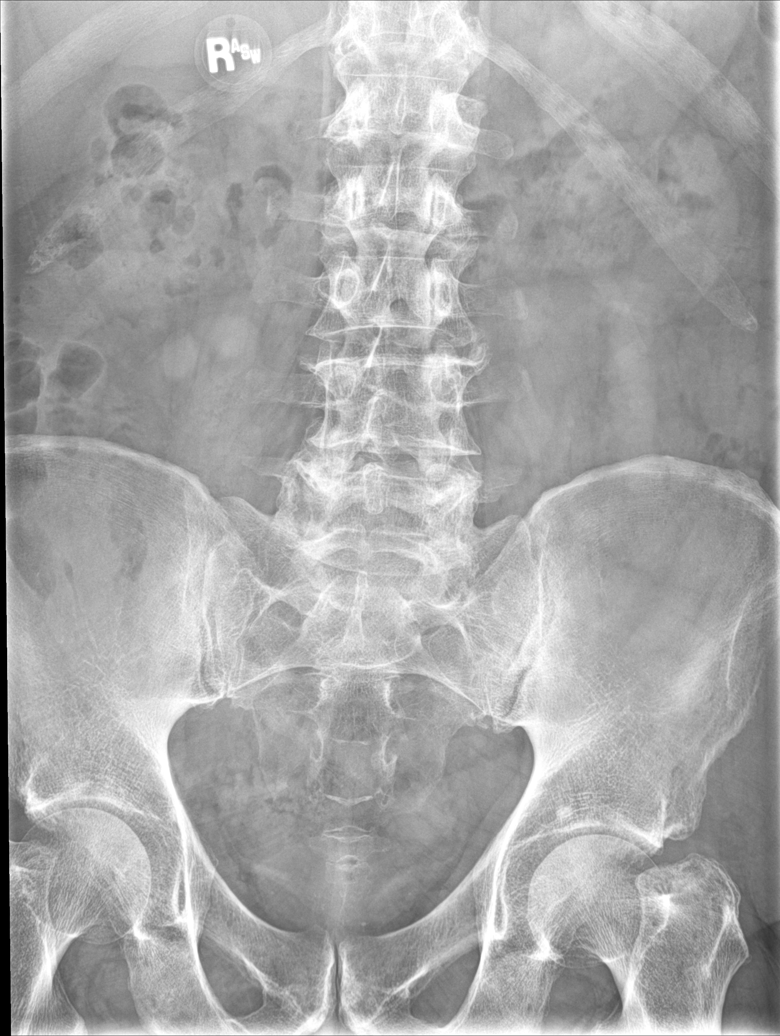

[l-spine obl (1 of 3)]
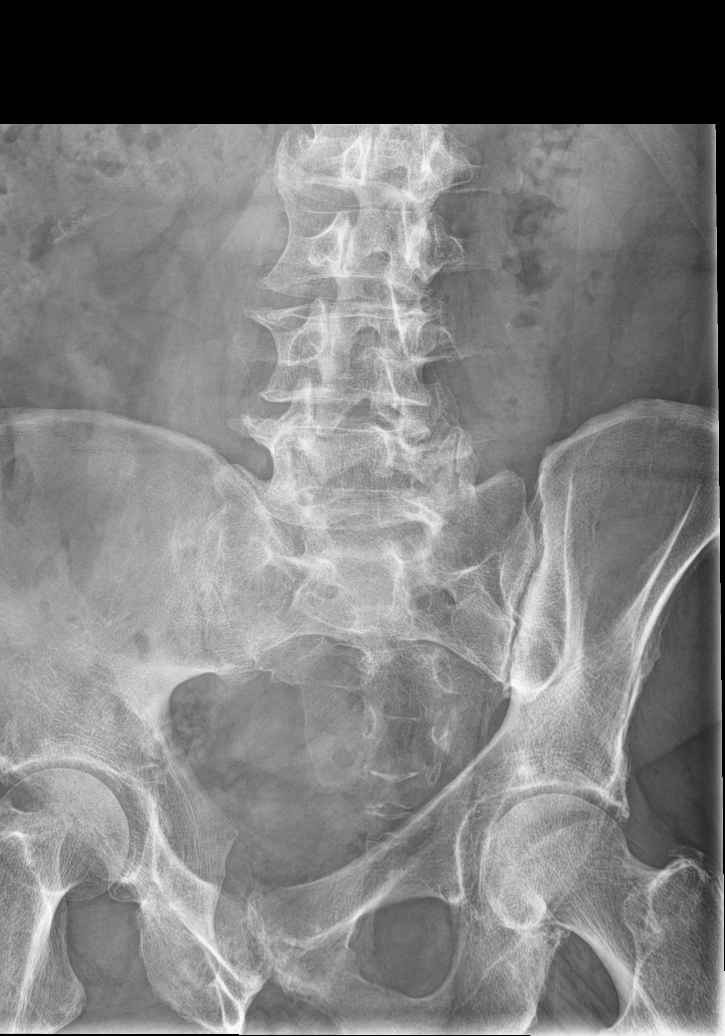

[l-spine lat]
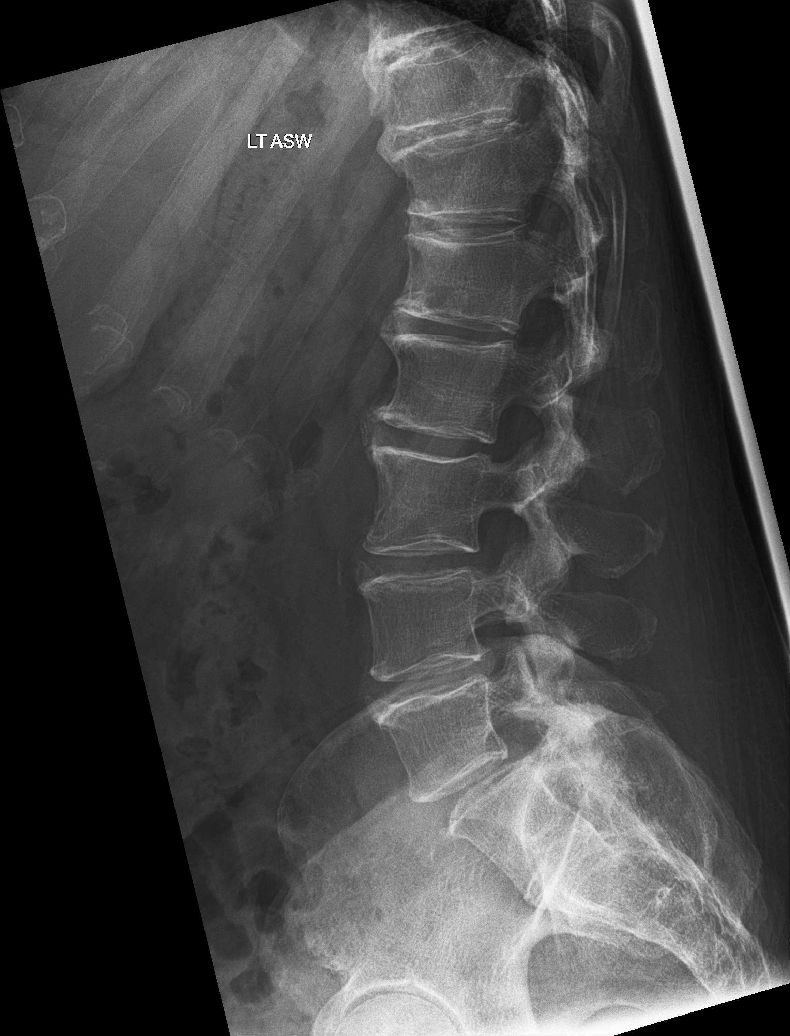

[l-spine spot]
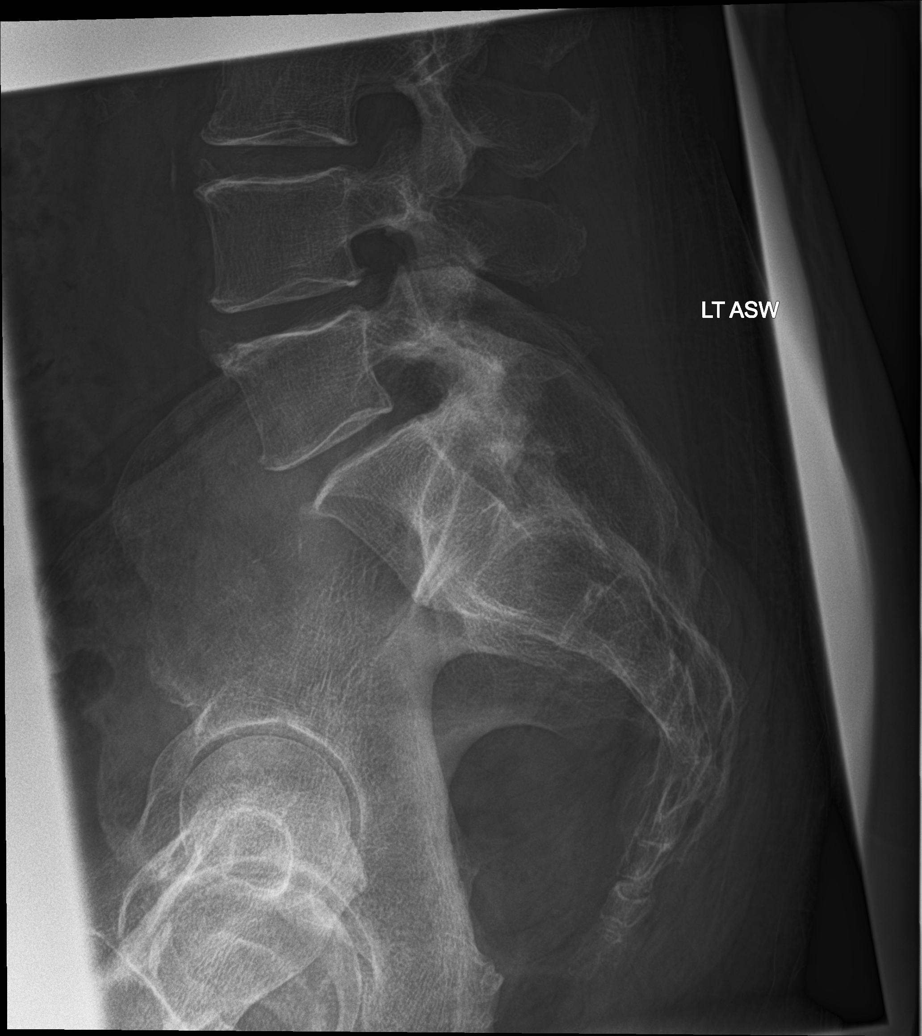

[l-spine obl (2 of 3)]
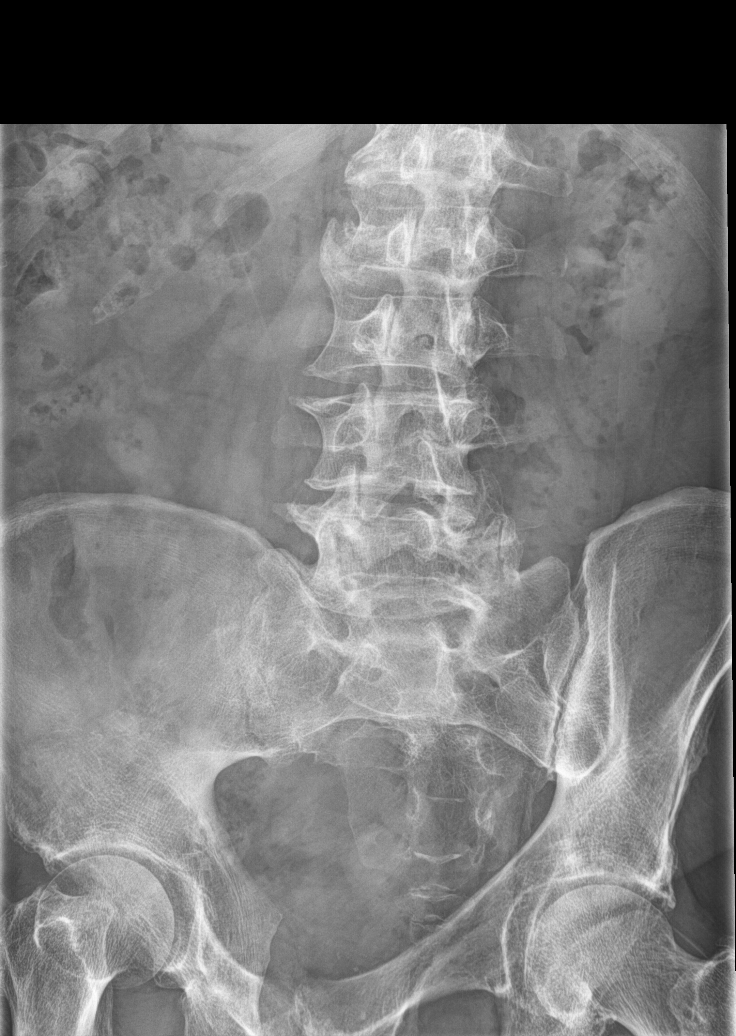

[l-spine obl (3 of 3)]
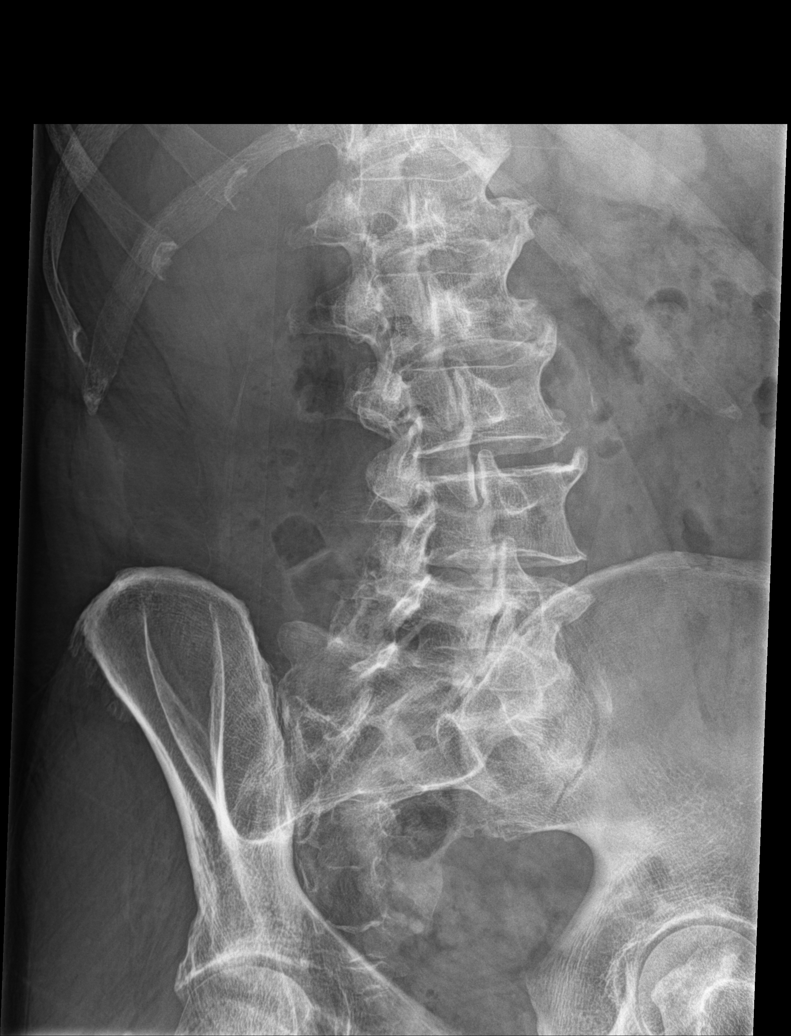

[6 of 6 positions shown; findings below may reference images not displayed]

FINDINGS: There is no evidence of lumbar spine fracture. Grade 1
anterolisthesis at L5-S1. Moderate L5-S1 facet hypertrophy.
Intervertebral disc spaces are maintained.
IMPRESSION: Grade 1 L5-S1 anterolisthesis and moderate facet hypertrophy.

## 2019-08-19 MED ORDER — BACLOFEN 10 MG PO TABS: 10.0000 mg | ORAL_TABLET | Freq: Three times a day (TID) | ORAL | 0 refills | Status: DC | PRN

## 2019-08-19 MED ORDER — MELOXICAM 15 MG PO TABS: 15.0000 mg | ORAL_TABLET | Freq: Every day | ORAL | 0 refills | Status: DC

## 2019-08-19 NOTE — Discharge Instructions (Signed)
-  Meloxicam 1 tablet daily.  Do not take ibuprofen or naproxen while taking this medication -Baclofen: 2 mg 3 times daily as needed for muscle spasms. -Can continue to take acetaminophen for pain -low back exercises as attached -drink plenty of water with exercises -Recommend follow-up with primary care provider for possible physical therapy

## 2019-08-19 NOTE — ED Triage Notes (Signed)
Patient c/o low back pain x 5 weeks. He states he turned to do something yesterday and he made it worse.

## 2019-08-19 NOTE — ED Provider Notes (Signed)
[facility]    CSN: [id number] Arrival date & time: 08/19/19  1558      History   Chief Complaint Chief Complaint  Patient presents with  . Back Pain    HPI Jonathon Wise is a 69 y.o. male.   Patient is a 69 year old male who presents with complaint of back pain x5 days.  Patient reports pain is wincing at times.  Patient states the pain intensity will wax and wane, stating he will go anywhere from 3/4 away to attend.  Patient states he saw his PCP a couple weeks ago.  During that appointment he was given new prescriptions and they were hoping that he would improve the next couple weeks and avoid further work-up.  Patient reports he had trouble standing last night.  Patient states he was getting up off the toilet when he turned a bit had a episode of intense pain.  It took a couple minutes for the pain to light and for him to be able to turn and start walking again.  Patient dates he is taken ibuprofen over-the-counter, 2 tablets every 6 hours as well as arthritic strength acetaminophen 3 times a day.  Patient also has been using analgesic patches over-the-counter, Biofreeze, as well as heating pads.  Patient denies any radiculopathy symptoms including pain numbness or tingling down his legs.  Patient states he does not remember any initial injury movement or motion that caused his pain to start 5 weeks ago.    Per PCP note from January 19: Patient would like to manage with chiropractic care. Discussed red flag symptoms and recommendation for physical therapy. Elected to use over-the-counter medication as needed.     Past Medical History:  Diagnosis Date  . Anxiety   . Diabetes mellitus without complication ([place])   . GERD (gastroesophageal reflux disease)   . Hyperlipidemia   . Hypertension   . Thyroid activity decreased     There are no problems to display for this patient.   Past Surgical History:  Procedure Laterality Date  . JOINT REPLACEMENT Right    index  . MENISCUS REPAIR    . rotate    . ROTATOR CUFF REPAIR     2 on right and 2 on left  . TONSILLECTOMY         Home Medications    Prior to Admission medications   Medication Sig Start Date End Date Taking? Authorizing Provider  aspirin EC 81 MG tablet Take 81 mg by mouth daily.   Yes [provider]  Calcium Carb-Cholecalciferol (CALCIUM 600+D) 600-800 MG-UNIT TABS Take 1 tablet by mouth daily.   Yes [provider]  fluticasone (FLONASE) 50 MCG/ACT nasal spray Place 2 sprays into both nostrils daily.    Yes [provider]  lisinopril (PRINIVIL,ZESTRIL) 10 MG tablet Take 10 mg by mouth daily.   Yes [provider]  metoprolol succinate (TOPROL-XL) 50 MG 24 hr tablet Take 50 mg by mouth daily. Take with or immediately following a meal.   Yes [provider]  Multiple Vitamin (MULTIVITAMIN) tablet Take 1 tablet by mouth daily.   Yes [provider]  Omega-3 1000 MG CAPS Take 1 capsule by mouth daily.   Yes [provider]  omeprazole (PRILOSEC) 20 MG capsule Take 20 mg by mouth 2 (two) times daily before a meal.    Yes [provider]  pramipexole (MIRAPEX) 1.5 MG tablet Take 1.5 mg by mouth at bedtime.   Yes [provider]  Probiotic Product (PROBIOTIC DAILY PO) Take 1 tablet by mouth daily.   Yes [provider]  simvastatin (ZOCOR) 10 MG tablet Take 10 mg by mouth daily.   Yes [provider]  Turmeric Curcumin 500 MG CAPS Take 1 capsule by mouth daily.   Yes [provider]  valACYclovir (VALTREX) 500 MG tablet Take 1,000 mg by mouth 2 (two) times daily. X 1 day for fever blisters   Yes [provider]  Vitamin D, Ergocalciferol, 2000 units CAPS Take 1 capsule by mouth daily.   Yes [provider]  metFORMIN (GLUCOPHAGE) 500 MG tablet Take 500 mg by mouth 2 (two) times daily. 02/06/17 08/19/19 Yes [provider]  levothyroxine (SYNTHROID) 137 MCG  tablet Take 137 mcg by mouth daily. 12/16/16 12/23/18  [provider]    Family History Family History  Problem Relation Age of Onset  . Diabetes Sister   . Diabetes Maternal Aunt   . Diabetes Maternal Uncle   . Diabetes Maternal Aunt     Social History Social History   Tobacco Use  . Smoking status: Never Smoker  . Smokeless tobacco: Never Used  Substance Use Topics  . Alcohol use: No    Comment: rarely - few in the last 3 years  . Drug use: No     Allergies   Fentanyl   Review of Systems Review of Systems as above in HPI.  Other systems reviewed and found to be negative   Physical Exam Triage Vital Signs ED Triage Vitals  Enc Vitals Group     BP 08/19/19 1609 (!) 157/83     Pulse Rate 08/19/19 1609 72     Resp 08/19/19 1609 18     Temp 08/19/19 1609 98 F (36.7 C)     Temp Source 08/19/19 1609 Oral     SpO2 08/19/19 1609 100 %     Weight 08/19/19 1607 212 lb (96.2 kg)     Height 08/19/19 1607 5\' 8"  (1.727 m)     Head Circumference --      Peak Flow --      Pain Score 08/19/19 1607 2     Pain Loc --      Pain Edu? --      Excl. in [place]? --    No data found.  Updated Vital Signs BP (!) 157/83 (BP Location: Left Arm)   Pulse 72   Temp 98 F (36.7 C) (Oral)   Resp 18   Ht 5\' 8"  (1.727 m)   Wt 212 lb (96.2 kg)   SpO2 100%   BMI 32.23 kg/m   Visual Acuity Right Eye Distance:   Left Eye Distance:   Bilateral Distance:    Right Eye Near:   Left Eye Near:    Bilateral Near:     Physical Exam Constitutional:      Appearance: Normal appearance.  Cardiovascular:     Rate and Rhythm: Normal rate.  Pulmonary:     Effort: Pulmonary effort is normal.     Breath sounds: Normal breath sounds.  Musculoskeletal:        General: Normal range of motion.     Lumbar back: Tenderness (bilateral lower paraspinal muscles to palpation. ) present. No deformity.     Comments: Pt with normal range of motion but states pain isnt as bad right now.     Neurological:     Mental Status: He is alert.      UC Treatments /  Results  Labs (all labs ordered are listed, but only abnormal results are displayed) Labs Reviewed - No data to display  EKG   Radiology DG Lumbar Spine Complete  Result Date: 08/19/2019 CLINICAL DATA:  Back pain for 5 months EXAM: LUMBAR SPINE - COMPLETE 4+ VIEW COMPARISON:  None. FINDINGS: There is no evidence of lumbar spine fracture. Grade 1 anterolisthesis at L5-S1. Moderate L5-S1 facet hypertrophy. Intervertebral disc spaces are maintained. IMPRESSION: Grade 1 L5-S1 anterolisthesis and moderate facet hypertrophy. Electronically Signed   By: Ulyses Jarred M.D.   On: 08/19/2019 16:54    Procedures Procedures (including critical care time)  Medications Ordered in UC Medications - No data to display  Initial Impression / Assessment and Plan / UC Course  I have reviewed the triage vital signs and the nursing notes.  Pertinent labs & imaging results that were available during my care of the patient were reviewed by me and considered in my medical decision making (see chart for details).    Patient with ongoing back pain.  Patient reports excessive wax and wanes.  States pain could be screaming 10 to make it difficult for him to rotate at the waist or even to walk.  Patient has tried over-the-counter medications including ibuprofen, acetaminophen, analgesic patches, Biofreeze, as well as heating pad without any improvement.  Per his PCP note he deferred physical therapy at that time and was wanting to continue with his chiropractor.  X-ray of the lumbar spine obtained.  We will give patient prescription for meloxicam for his pain as well as baclofen for spasms.  Advised him to avoid ibuprofen naproxen will take this medication.  He can continue to take the acetaminophen.  Will recommend him follow back up with his primary care provider for possible physical therapy.  Final Clinical Impressions(s) / UC Diagnoses    Final diagnoses:  Strain of lumbar region, initial encounter     Discharge Instructions     -Meloxicam 1 tablet daily.  Do not take ibuprofen or naproxen while taking this medication -Can continue to take acetaminophen for pain -low back exercises as attached -drink plenty of water with exercises    ED Prescriptions    None     PDMP not reviewed this encounter.   Luvenia Redden, PA-C 08/19/19 1703

## 2019-11-04 NOTE — Progress Notes (Signed)
11/05/19 8:43 AM   Jonathon Wise [birth year] [id number]  Referring provider: Nelwyn Salisbury, PA-C [place],  [place] [address] Chief Complaint  Patient presents with  . Erectile Dysfunction    HPI: Jonathon Wise is a 69 y.o. M who presents today for continued management of ED erectile dysfunction.   Previously seen in June at [facility] who later presented to [facility] for second option who returns today.  He presented at [facility] in [place] on 03/12/2019 c/o of erectile dysfunction, testicular pain and buried penis. He first started noticing pain on approximately 03/08/2009 followed by gradually worsening symptoms. His symptoms have been worse the previous year prior to visit. He has difficulties w/ achieving and maintaining erections. Sildenafil gives him a HA. He was prescribed Cialis at this time. He has not been able to test out Cialis as much as he wanted to due to wife not being interested in sex.  He underwent penile shockwave therapy for his ED w/o improvement at [facility]. He is still not able to maintain an erection however only waits 45 minutes as opposed to 2 hours prior to sexual activity.   He was started on AndroGel by [facility] after rechecking his testosterone. He reports of only having 1 testosterone check at [facility] and has not noticed any changes in energy levels or erections. His testosterone was wnl when it was checked at [facility] prior to treatment. He reports of only having 1 testosterone check that was low.   Most recent PSA 0.56 as of 07/27/19.   PMH: Past Medical History:  Diagnosis Date  . Anxiety   . Diabetes mellitus without complication ([place])   . GERD (gastroesophageal reflux disease)   . Hyperlipidemia   . Hypertension   . Thyroid activity decreased     Surgical History: Past Surgical History:  Procedure Laterality Date  . JOINT REPLACEMENT Right    index  . MENISCUS REPAIR    .  rotate    . ROTATOR CUFF REPAIR     2 on right and 2 on left  . TONSILLECTOMY      Home Medications:  Allergies as of 11/05/2019      Reactions   Fentanyl Shortness Of Breath      Medication List       Accurate as of November 05, 2019 11:59 PM. If you have any questions, ask your nurse or doctor.        STOP taking these medications   aspirin EC 81 MG tablet Stopped by: Hollice Espy, MD   baclofen 10 MG tablet Commonly known as: LIORESAL Stopped by: Hollice Espy, MD   meloxicam 15 MG tablet Commonly known as: Mobic Stopped by: Hollice Espy, MD   Vitamin D (Ergocalciferol) 50 MCG (2000 UT) Caps Stopped by: Hollice Espy, MD     TAKE these medications   Calcium 600+D 600-800 MG-UNIT Tabs Generic drug: Calcium Carb-Cholecalciferol Take 1 tablet by mouth daily.   clonazePAM 1 MG tablet Commonly known as: KLONOPIN Take by mouth.   flecainide 50 MG tablet Commonly known as: TAMBOCOR Take 50 mg by mouth 2 (two) times daily.   fluticasone 50 MCG/ACT nasal spray Commonly known as: FLONASE Place 2 sprays into both nostrils daily.   lisinopril 10 MG tablet Commonly known as: ZESTRIL Take 10 mg by mouth daily. What changed: Another medication with the same name was removed. Continue taking this medication, and follow the directions you see here. Changed by:  Hollice Espy, MD   LORazepam 1 MG tablet Commonly known as: ATIVAN Take 1 mg by mouth 2 (two) times daily as needed.   metoprolol succinate 50 MG 24 hr tablet Commonly known as: TOPROL-XL Take 50 mg by mouth daily. Take with or immediately following a meal.   multivitamin tablet Take 1 tablet by mouth daily.   NONFORMULARY OR COMPOUNDED ITEM Trimix (30/1/10)-(Pap/Phent/PGE)  Test Dose  61ml vial   Qty #[address] [place] [phone] Fax [phone] Started by: Hollice Espy, MD   Omega-3 1000 MG Caps Take 1 capsule by mouth daily.   omeprazole 20 MG capsule Commonly  known as: PRILOSEC Take 20 mg by mouth 2 (two) times daily before a meal.   pramipexole 1.5 MG tablet Commonly known as: MIRAPEX Take 1.5 mg by mouth 3 (three) times daily. What changed: Another medication with the same name was removed. Continue taking this medication, and follow the directions you see here. Changed by: Hollice Espy, MD   PROBIOTIC DAILY PO Take 1 tablet by mouth daily.   simvastatin 10 MG tablet Commonly known as: ZOCOR Take 10 mg by mouth daily. What changed: Another medication with the same name was removed. Continue taking this medication, and follow the directions you see here. Changed by: Hollice Espy, MD   Synthroid 137 MCG tablet Generic drug: levothyroxine Take 137 mcg by mouth daily.   tadalafil 10 MG tablet Commonly known as: CIALIS Take 0.5 tablets (5 mg total) by mouth daily as needed for erectile dysfunction. What changed:   how much to take  when to take this  reasons to take this Changed by: Hollice Espy, MD   Testosterone 12.5 MG/ACT (1%) Gel 2 Pump once daily   triamcinolone acetonide 40 MG/ML Susp Commonly known as: TRIESENCE Inject into the articular space.   Turmeric Curcumin 500 MG Caps Take 1 capsule by mouth daily.   valACYclovir 500 MG tablet Commonly known as: VALTREX Take 1,000 mg by mouth 2 (two) times daily. X 1 day for fever blisters       Allergies:  Allergies  Allergen Reactions  . Fentanyl Shortness Of Breath    Family History: Family History  Problem Relation Age of Onset  . Diabetes Sister   . Diabetes Maternal Aunt   . Diabetes Maternal Uncle   . Diabetes Maternal Aunt     Social History:  reports that he has never smoked. He has never used smokeless tobacco. He reports that he does not drink alcohol or use drugs.   Physical Exam: BP (!) 172/80   Pulse 77   Ht 5\' 8"  (1.727 m)   Wt 210 lb (95.3 kg)   BMI 31.93 kg/m   Constitutional:  Alert and oriented, No acute distress. HEENT:   AT, moist mucus membranes.  Trachea midline, no masses. Cardiovascular: No clubbing, cyanosis, or edema. Respiratory: Normal respiratory effort, no increased work of breathing. Skin: No rashes, bruises or suspicious lesions. Neurologic: Grossly intact, no focal deficits, moving all 4 extremities. Psychiatric: Normal mood and affect.  Laboratory Data:  Lab Results  Component Value Date   CREATININE 0.90 06/28/2019   Assessment & Plan:    1. Hypogonadism  No real change in energy level and erections despite having normal testosterone on Androgel prior to starting. He had normal testosterone last time we checked in office.  Given lack of symptoms advised to abstain from treatment at this time  2. ED  Refractory to PDE-5 inhibitors and penile  shockwave therapy  We discussed alternative treatments for erectile dysfunction today including intracavernosal injections, vacuum erection device and penile prosthesis at length again today.  We discussed our protocol for intracavernosal injections which he is interested in.  Will return for Trimix injection teaching w/ PA-C  Script for test dose sent to [facility] [address], [place], [place] [address] [phone]  I, Lucas Mallow, am acting as a scribe for Dr. Hollice Espy,  I have reviewed the above documentation for accuracy and completeness, and I agree with the above.   Hollice Espy, MD

## 2019-11-05 ENCOUNTER — Other Ambulatory Visit: Payer: Self-pay

## 2019-11-05 ENCOUNTER — Ambulatory Visit (INDEPENDENT_AMBULATORY_CARE_PROVIDER_SITE_OTHER): Payer: Medicare HMO | Admitting: Urology

## 2019-11-05 ENCOUNTER — Encounter: Payer: Self-pay | Admitting: Urology

## 2019-11-05 VITALS — BP 172/80 | HR 77 | Ht 68.0 in | Wt 210.0 lb

## 2019-11-05 DIAGNOSIS — N521 Erectile dysfunction due to diseases classified elsewhere: Secondary | ICD-10-CM

## 2019-11-05 DIAGNOSIS — E1169 Type 2 diabetes mellitus with other specified complication: Secondary | ICD-10-CM | POA: Diagnosis not present

## 2019-11-05 DIAGNOSIS — E291 Testicular hypofunction: Secondary | ICD-10-CM | POA: Diagnosis not present

## 2019-11-05 MED ORDER — TADALAFIL 10 MG PO TABS: 5.0000 mg | ORAL_TABLET | Freq: Every day | ORAL | 1 refills | Status: AC | PRN

## 2019-11-05 MED ORDER — NONFORMULARY OR COMPOUNDED ITEM: 0 refills | Status: DC

## 2019-11-05 MED ORDER — NONFORMULARY OR COMPOUNDED ITEM: 0 refills | Status: AC

## 2020-05-11 ENCOUNTER — Other Ambulatory Visit (HOSPITAL_COMMUNITY): Payer: Self-pay | Admitting: Neurology

## 2020-05-11 DIAGNOSIS — R4189 Other symptoms and signs involving cognitive functions and awareness: Secondary | ICD-10-CM

## 2020-05-22 ENCOUNTER — Other Ambulatory Visit: Payer: Self-pay

## 2020-05-22 ENCOUNTER — Ambulatory Visit (HOSPITAL_COMMUNITY)
Admission: RE | Admit: 2020-05-22 | Discharge: 2020-05-22 | Disposition: A | Discharge status: Home / Self Care | Payer: Medicare HMO | Source: Ambulatory Visit | Attending: Neurology | Admitting: Neurology

## 2020-05-22 DIAGNOSIS — R4189 Other symptoms and signs involving cognitive functions and awareness: Secondary | ICD-10-CM | POA: Diagnosis not present

## 2020-05-22 IMAGING — MR MR HEAD W/O CM
11 of 13 series · 30 of 48 positions shown · non-contrast
Comparison: MRI [DATE]

CLINICAL DATA: Cognitive impairment.

EXAM:
MRI HEAD WITHOUT CONTRAST
TECHNIQUE: Multiplanar, multiecho pulse sequences of the brain and surrounding
structures were obtained without intravenous contrast.
Additionally, using NeuroQuant software a 3D volumetric analysis of
the brain was performed and is compared to a normative database
adjusted for age, gender and intracranial volume.

[Series 2: T1 · sagittal · 1.2mm · 0.94mm/px · 6 of 158 slices shown]
[im 1/158]
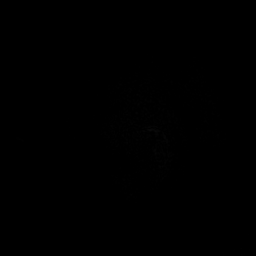
[im 32/158]
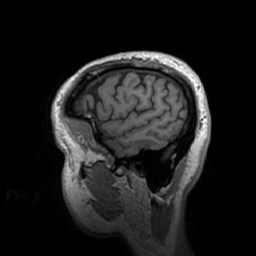
[im 63/158]
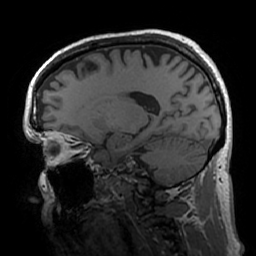
[im 95/158]
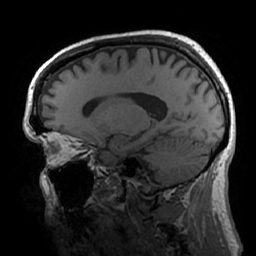
[im 126/158]
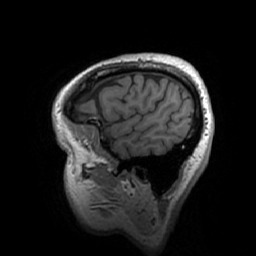
[im 158/158]
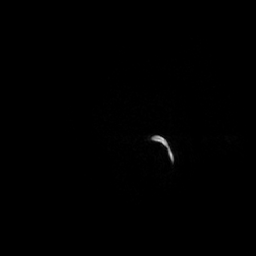

[Series 3: DWI · axial · 3.0mm · 0.94mm/px · z∈[-87,+72]mm · 4 of 108 slices shown (1 of 2)]
[im 1/108]
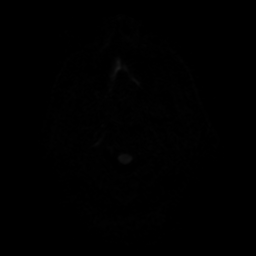
[im 36/108]
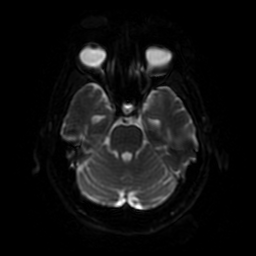
[im 72/108]
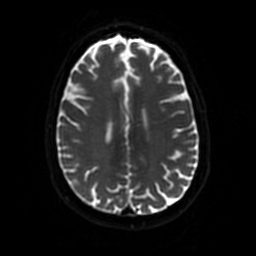
[im 108/108]
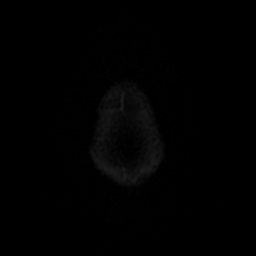

[Series 4: DWI · coronal · 4.0mm · 0.94mm/px · 3 of 80 slices shown (2 of 2)]
[im 1/80]
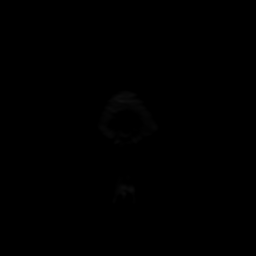
[im 40/80]
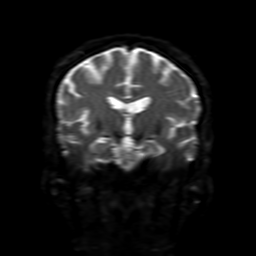
[im 80/80]
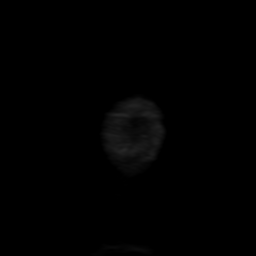

[Series 5: FLAIR · sagittal · 5.0mm · 0.23mm/px · 1 of 25 slices shown (1 of 2)]
[im 1/25]
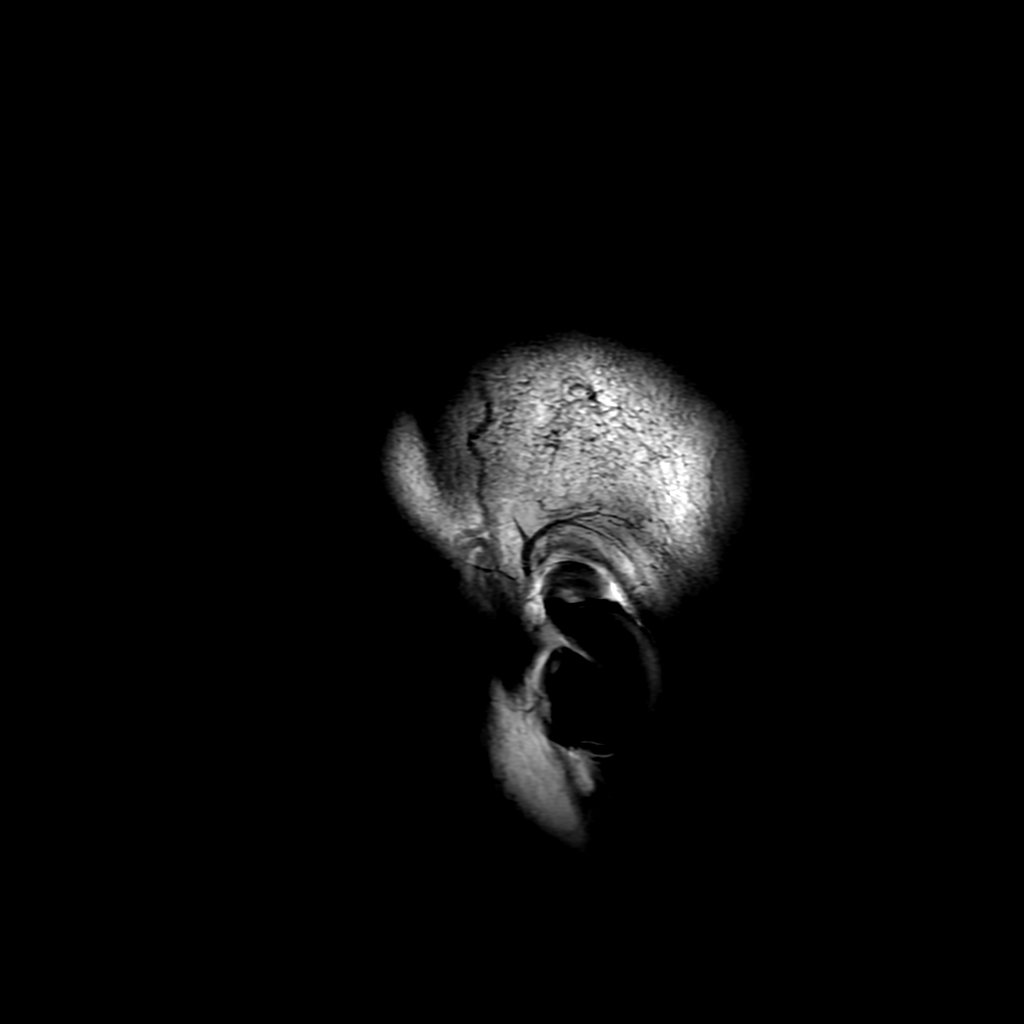

[Series 6: T2 · axial · 5.0mm · 0.23mm/px · 1 of 26 slices shown]
[im 1/26]
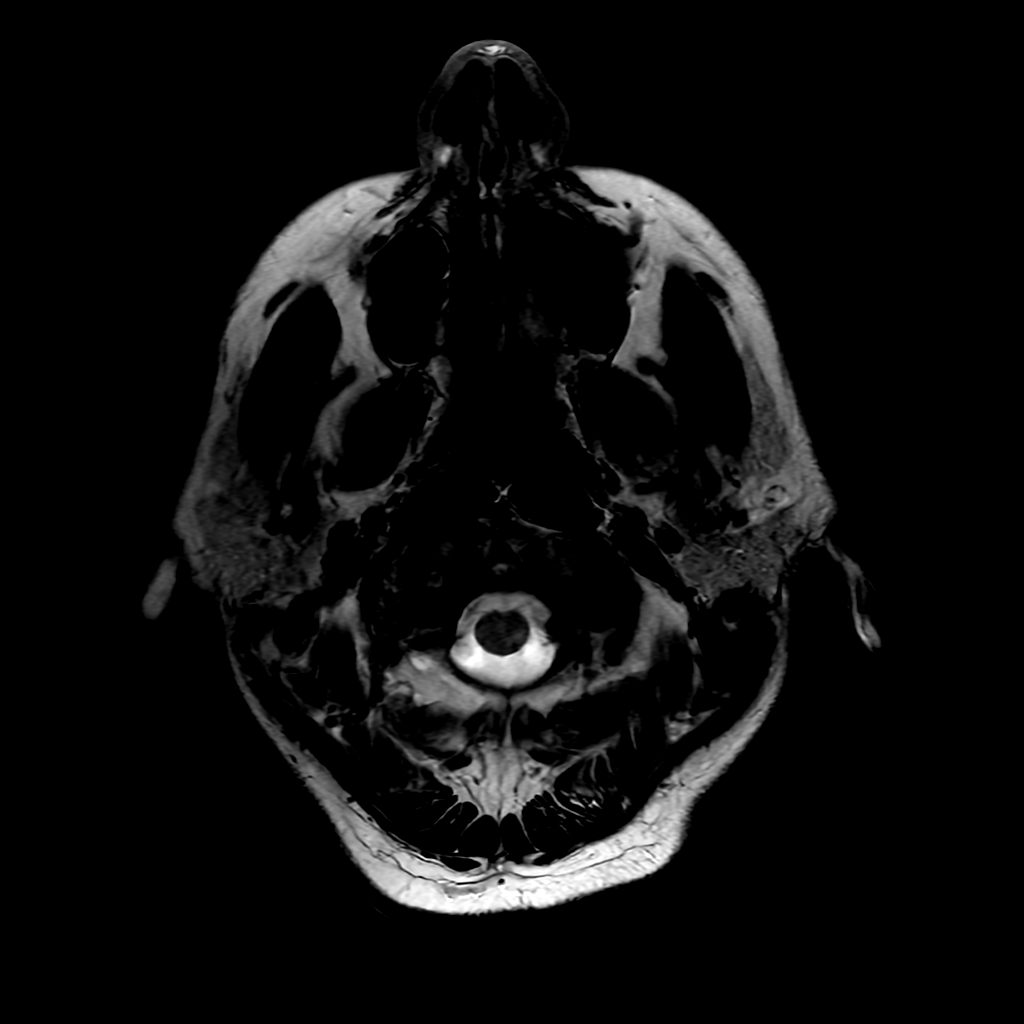

[Series 7: FLAIR · axial · 3.0mm · 0.45mm/px · 1 of 26 slices shown (2 of 2)]
[im 1/26]
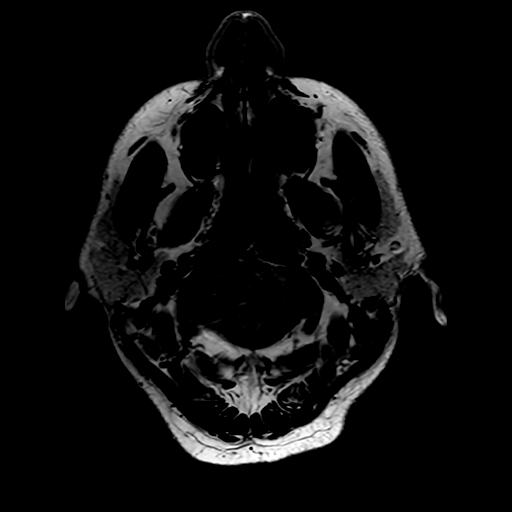

[Series 8: (person_name) · axial · 3.0mm · 0.47mm/px · z∈[-88,+66]mm · 4 of 104 slices shown]
[im 1/104]
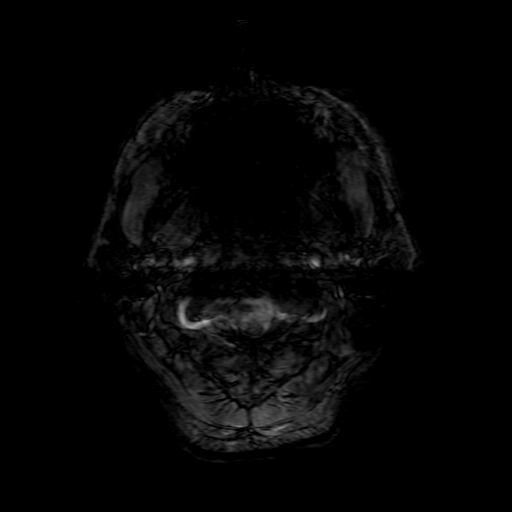
[im 35/104]
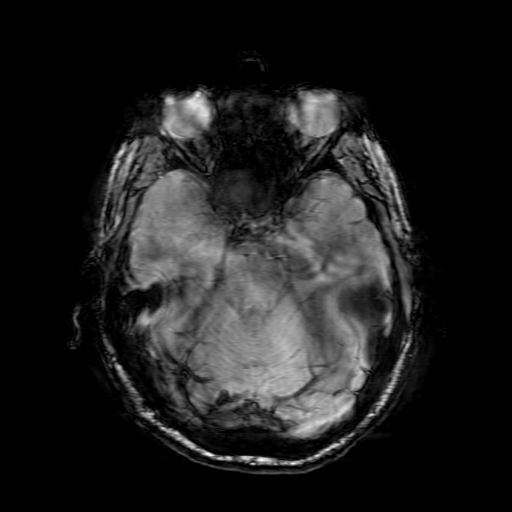
[im 69/104]
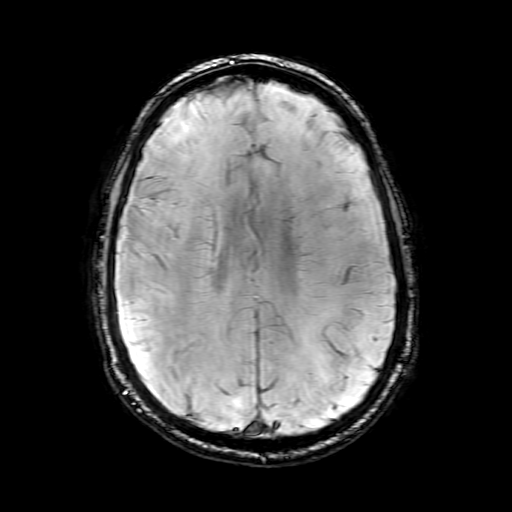
[im 104/104]
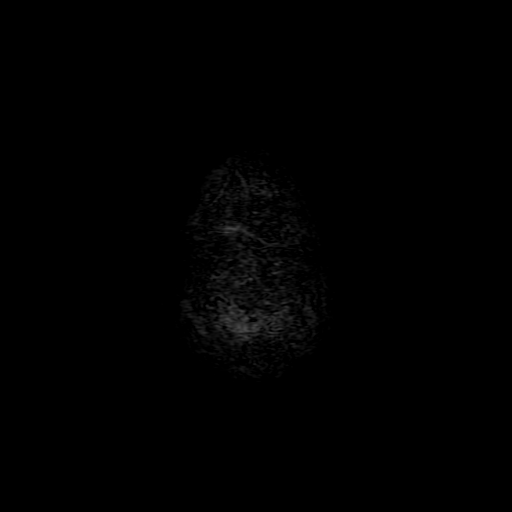

[Series 9: ax 3(person_name) pre · axial · non-contrast · 3.0mm · 0.94mm/px · z∈[-88,+64]mm · 2 of 52 slices shown]
[im 1/52]
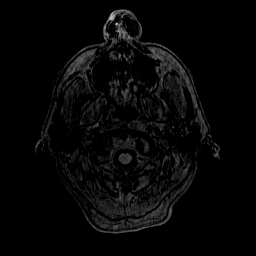
[im 52/52]
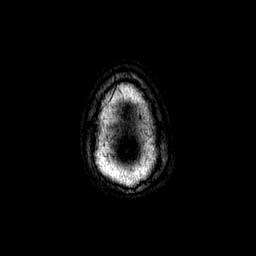

[Series 52: nqsegcb_sc_cor · 1.00mm/px · 5 of 234 slices shown]
[im 1/234]
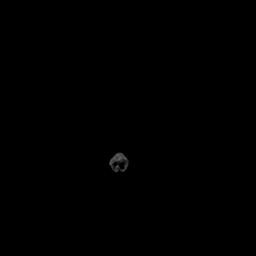
[im 34/234]
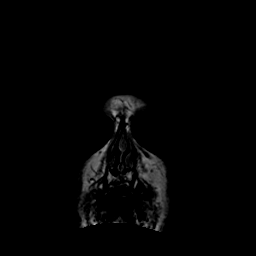
[im 67/234]
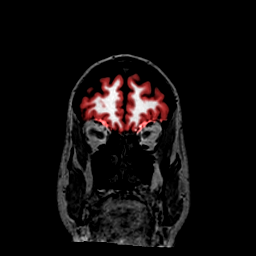
[im 100/234]
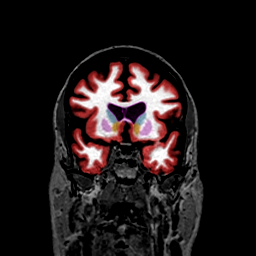
[im 134/234]
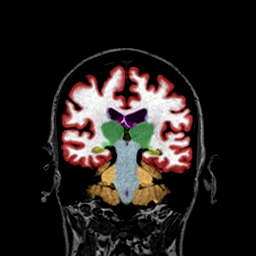

[Series 350: ADC · axial · 3.0mm · 0.94mm/px · z∈[-87,+72]mm · 2 of 54 slices shown (1 of 2)]
[im 1/54]
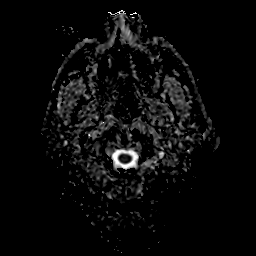
[im 54/54]
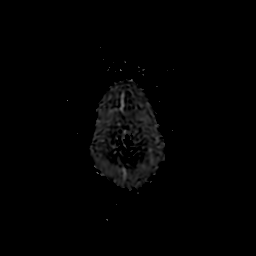

[Series 450: ADC · coronal · 4.0mm · 0.94mm/px · 1 of 40 slices shown (2 of 2)]
[im 1/40]
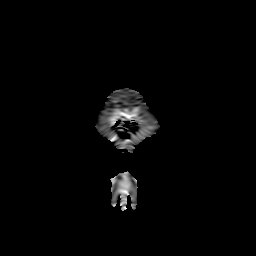

[30 of 48 positions shown; findings below may reference images not displayed]

FINDINGS: Brain: No acute infarction, hemorrhage, hydrocephalus, extra-axial
collection or mass lesion.Mild scattered T2/FLAIR hyperintensities
within the white matter, within normal limits for patient age.
Generalized cerebral atrophy.

Vascular: Major arterial flow voids are maintained at the skull
base.

Skull and upper cervical spine: Normal marrow signal. Degenerative
pannus at the craniocervical junction without evidence of high-grade
canal stenosis.

Sinuses/Orbits: Scattered paranasal sinus mucosal thickening.
Unremarkable orbits.

Other: No mastoid effusions.

NeuroQuant Findings:

Volumetric analysis of the brain was performed, with a fully
detailed report in [HOSPITAL] PACS. Briefly, the comparison with age and
gender matched reference reveals fifteenth percentile normative
whole brain volume.
IMPRESSION: 1. No evidence of acute intracranial abnormality.
2. NeuroQuant volumetric analysis of the brain, see details on
[HOSPITAL] PACS.

## 2020-12-03 ENCOUNTER — Other Ambulatory Visit: Payer: Self-pay | Admitting: Urology

## 2020-12-09 ENCOUNTER — Other Ambulatory Visit: Payer: Self-pay | Admitting: Urology

## 2021-09-20 ENCOUNTER — Encounter: Payer: Self-pay | Admitting: Speech Pathology

## 2021-09-25 ENCOUNTER — Other Ambulatory Visit: Payer: Self-pay

## 2021-09-25 ENCOUNTER — Ambulatory Visit: Payer: Medicare HMO | Attending: Neurology | Admitting: Speech Pathology

## 2021-09-25 DIAGNOSIS — R1312 Dysphagia, oropharyngeal phase: Secondary | ICD-10-CM | POA: Insufficient documentation

## 2021-09-25 DIAGNOSIS — R490 Dysphonia: Secondary | ICD-10-CM | POA: Insufficient documentation

## 2021-09-25 DIAGNOSIS — R41841 Cognitive communication deficit: Secondary | ICD-10-CM | POA: Diagnosis present

## 2021-09-25 DIAGNOSIS — G20 Parkinson's disease: Secondary | ICD-10-CM | POA: Diagnosis present

## 2021-09-26 NOTE — Therapy (Signed)
[facility] [place] [facility] ?[place], [place], [address] ?Phone: [phone]   Fax:  [phone] ? ?Speech Language Pathology Evaluation ? ?Patient Details  ?Name: Jonathon Wise ?MRN: [id number] ?Date of Birth: [birth year] ?Referring Provider (SLP): Dr Jennings Books ? ? ?Encounter Date: 09/25/2021 ? ? End of Session - 09/26/21 2124   ? ? Visit Number 1   ? Number of Visits 25   ? Date for SLP Re-Evaluation 12/19/21   ? Authorization Type [employer] HMO/PPO   ? Authorization Time Period 09/25/2021 thru 12/19/2021   ? Authorization - Visit Number 1   ? Progress Note Due on Visit 10   ? SLP Start Time 1100   ? SLP Stop Time  1230   ? SLP Time Calculation (min) 90 min   ? Activity Tolerance Patient tolerated treatment well   ? ?  ?  ? ?  ? ? ?Past Medical History:  ?Diagnosis Date  ? Anxiety   ? Diabetes mellitus without complication ([place])   ? GERD (gastroesophageal reflux disease)   ? Hyperlipidemia   ? Hypertension   ? Thyroid activity decreased   ? ? ?Past Surgical History:  ?Procedure Laterality Date  ? JOINT REPLACEMENT Right   ? index  ? MENISCUS REPAIR    ? rotate    ? ROTATOR CUFF REPAIR    ? 2 on right and 2 on left  ? TONSILLECTOMY    ? ? ?There were no vitals filed for this visit. ? ? Subjective Assessment - 09/26/21 2106   ? ? Subjective accompanied by his wife, pleasant, conversant "I am hoarse today"   ? Patient is accompained by: Family member   ? Currently in Pain? No/denies   ? ?  ?  ? ?  ? ? ? ? ? SLP Evaluation [facility] - 09/26/21 2106   ? ?  ? SLP Visit Information  ? SLP Received On 09/25/21   ? Referring Provider (SLP) Dr Jennings Books   ? Onset Date 08/17/2021   ? Medical Diagnosis Parkinson's Disease   ?  ? General Information  ? HPI Pt is a 71 year old male who was referred by his neurologist, Dr Jennings Books on 08/17/2021 d/t high probability of Parkinson's disease confirmed on Syn-One biopsy in a patient with right hemi-body bradykinesia,  shuffling gait, hunched forward posture, dream enhancement, decreased sense of smell, decreased volume of speech during conversation, bradykinesia. Per chart, pt expressed concerns with his memory (SLUMS - 09/06/2020 - 21 out of 30).  ? ?Past medical history includes: adjustment disorder with mixed anxiety and depressed mood, hypertension, GERD, hypothyroidism, recurrent oral herpes, vocal cord polyp, segmental myoclonus, hyperlipidemia mixed, hypothyroidism.  ? ? ? ?  ? Behavioral/Cognition noticeable cognitive deficits   ? Mobility Status ambulatory   ?  ? Balance Screen  ? Has the patient fallen in the past 6 months No   ? Has the patient had a decrease in activity level because of a fear of falling?  No   ? Is the patient reluctant to leave their home because of a fear of falling?  No   ?  ? Prior Functional Status  ? Cognitive/Linguistic Baseline Within functional limits   ? Type of Home House   ?  Lives With Spouse   ? Available Support Family   ? Vocation Part time employment   ?  ? Cognition  ? Overall Cognitive Status Impaired/Different from baseline  will seek order for cognition evaluation  ?  ? Auditory Comprehension  ? Overall Auditory Comprehension Appears within functional limits for tasks assessed   ?  ? Visual Recognition/Discrimination  ? Discrimination Within Function Limits   ?  ? Reading Comprehension  ? Reading Status Within functional limits   ?  ? Expression  ? Primary Mode of Expression Verbal   ?  ? Verbal Expression  ? Overall Verbal Expression Appears within functional limits for tasks assessed   ?  ? Written Expression  ? Dominant Hand Right   ?  ? Oral Motor/Sensory Function  ? Overall Oral Motor/Sensory Function Appears within functional limits for tasks assessed   ?  ? Motor Speech  ? Overall Motor Speech Impaired   ? Respiration Impaired   ? Level of Impairment Phrase   ? Phonation Hoarse;Low vocal intensity   ? Resonance Within functional limits   ? Articulation Within functional  limits   ? Intelligibility Intelligibility reduced   ? Word 75-100% accurate   ? Phrase 75-100% accurate   ? Sentence 75-100% accurate   ? Conversation 75-100% accurate   ? Motor Planning Impaired   ? Level of Impairment Phrase   ? Motor Speech Errors Unaware;Consistent   ? Effective Techniques Increased vocal intensity   ? Phonation Impaired   ? Vocal Abuses Habitual Cough/Throat Clear;Prolonged Vocal Use;Vocal Fold Dehydration   ? Tension Present Neck   ? Volume Soft   ? Pitch Appropriate   ?  ? Standardized Assessments  ? Standardized Assessments  --   LSVT Evaluation  ? ?  ?  ? ?  ? ? ?LSVT-LOUD Voice Evaluation ?Maximum phonation time for sustained ?ah?: 10 seconds ?Mean intensity during sustained ?ah?: 73 dB  ?Mean fundamental frequency during sustained ?ah?: 161 Hz (1 STD below average for age and gender) ?Mean intensity sustained during conversational speech: 70 dB ?Habitual pitch: 163 Hz ?Highest dynamic pitch when altering pitch from a low note to a high note: 278 Hz ?Lowest dynamic pitch when altering from a high note to a low note: 108 Hz ?Highest dynamic pitch during conversational speech: 230 Hz ?Lowest dynamic pitch during conversational speech: 119 Hz ?Speech is characterized by  hoarse vocal quality, hypophonia, fast rate/pressed speech, dysfluency. For trained listener in quiet environment with context, intelligibility was 75%. ?  ?Patient was not able to improve all parameters with model (?Loud like me?) d/t extensive training in voice ?Stimulability: Pt had difficulty requiring maximal assistance to inhibit singing during LOUD exercises  ?  ? ? ? ? SLP Education - 09/26/21 2123   ? ? Education Details results of this assessment, ST POC, LSVT LOUD, request order for cognition evaluation   ? Person(s) Educated Patient;Spouse   ? Methods Explanation;Demonstration;Tactile cues;Verbal cues   ? Comprehension Verbalized understanding;Need further instruction   ? ?  ?  ? ?  ? ? ? ? ? ? ? Plan - 09/26/21  2125   ? ? 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 In 2019 pt presented to [facility] d/t progressive hoarseness. Pt is s/p MDL with micro flap removal of ruptured cyst and B gelfoam augmentation (02/06/2018). Pt with 2 courses of voice therapy following augmentation. In addition he has history of GERD.  ? ?During this evaluation, pt presents with severe habitual throat clearing, decreased consumption of water, prolonged vocal use and tension in his neck  is observed when speaking. Educational handouts provided on habitual throat clearing, managing mucus, dehydration and instruction provided for strategies to employ to reduce throat clearing.  ? ?Patient presents with mild hypokinetic dysarthria characterized by mildly reduced vocal intensity, mildly reduced breath support at conversation level, reduced pitch variability, intermittent hoarse/gravelly vocal quality and fast/pressed speech. Pt was not able to reliably completed the Voice Handicap Index (VHI) d/t to decreased mental flexibility and anxiety over choosing between ratings. His selections on the VHI were contradictory to his comments regarding his voice during the actual assessment. Pt reported score of 32 indicating a moderate severity.  ? ?Throughout the assessment, pt struggled with performing LSVT LOUD tasks because he continually performed each task as if it were a professional voice therapy task (I.e., striving to hit a certain pitch rather than increasing the volume). As a result, pt was perseverative on his acute hoarseness d/t his allergies. During stimulability tasks, pt was unable to increase volume without increasing pitch to an atypical level.  ? ?I recommend course of skilled ST for LSVT LOUD to maximize pt's vocal quality for communication with family and friends with the possibility of changing to traditional  voice therapy if pt unable to perform LOUD exercises without increased vocal intensity.  ?  ? Speech Therapy Frequency 4x / week   ? Duration 4 weeks   ? Treatment/Interventions SLP instruction and feedback;

## 2021-09-27 ENCOUNTER — Other Ambulatory Visit: Payer: Self-pay

## 2021-09-27 ENCOUNTER — Encounter: Payer: Self-pay | Admitting: Speech Pathology

## 2021-09-27 ENCOUNTER — Ambulatory Visit: Payer: Medicare HMO | Admitting: Speech Pathology

## 2021-09-27 DIAGNOSIS — R41841 Cognitive communication deficit: Secondary | ICD-10-CM

## 2021-09-27 DIAGNOSIS — G20 Parkinson's disease: Secondary | ICD-10-CM

## 2021-09-27 DIAGNOSIS — R490 Dysphonia: Secondary | ICD-10-CM | POA: Diagnosis not present

## 2021-09-29 NOTE — Therapy (Signed)
[facility] ?[place], [place], [address] ?Phone: [phone]   Fax:  [phone] ? ?Speech Language Pathology  ?Cognitive Linguistic Evaluation ? ?Patient Details  ?Name: Jonathon Wise ?MRN: [id number] ?Date of Birth: Apr 11, 1951 ?Referring Provider (SLP): Dr Jennings Books ? ? ?Encounter Date: 09/27/2021 ? ? End of Session - 09/29/21 0733   ? ? Visit Number 2   ? Number of Visits 25   ? Date for SLP Re-Evaluation 12/19/21   ? Authorization Type [employer] Medicare HMO/PPO   ? Authorization Time Period 09/25/2021 thru 12/19/2021   ? Authorization - Visit Number 2   ? Progress Note Due on Visit 10   ? SLP Start Time 1000   ? SLP Stop Time  1100   ? SLP Time Calculation (min) 60 min   ? Activity Tolerance Patient tolerated treatment well   ? ?  ?  ? ?  ? ? ?Past Medical History:  ?Diagnosis Date  ? Anxiety   ? Diabetes mellitus without complication ([place])   ? GERD (gastroesophageal reflux disease)   ? Hyperlipidemia   ? Hypertension   ? Thyroid activity decreased   ? ? ?Past Surgical History:  ?Procedure Laterality Date  ? JOINT REPLACEMENT Right   ? index  ? MENISCUS REPAIR    ? rotate    ? ROTATOR CUFF REPAIR    ? 2 on right and 2 on left  ? TONSILLECTOMY    ? ? ?There were no vitals filed for this visit. ? ? Subjective Assessment - 09/29/21 0731   ? ? Subjective "I am not going to over-think"   ? Patient is accompained by: Family member   ? Currently in Pain? No/denies   ? ?  ?  ? ?  ? ? ? ? ? ? ? ? ADULT SLP TREATMENT - 09/29/21 0001   ? ?  ? Cognitive-Linquistic Treatment  ? Treatment focused on Cognition;Patient/family/caregiver education   ? Skilled Treatment Order received for formal cognitive assessment. See clinical impression statement.   ? ?  ?  ? ?  ? ? ? SLP Education - 09/29/21 0732   ? ? Education Details results of cognitive assessment, mental flexibility   ? Person(s) Educated Patient;Spouse   ? Methods Explanation;Demonstration;Verbal  cues   ? Comprehension Need further instruction   ? ?  ?  ? ?  ? ? ? SLP Short Term Goals - 09/29/21 0736   ? ?  ? SLP SHORT TERM GOAL #5  ? Title Pt will use external memory aids to recall basic information in 7 out of 10 opportunities.   ? Baseline new goal   ? Time 10   ? Period --   sessions  ? Status New   ? ?  ?  ? ?  ? ? ? SLP Long Term Goals - 09/27/21 0711   ? ?  ? SLP LONG TERM GOAL #1  ? Title The patient will participate in 15-20 minutes conversation, maintaining average loudness of 75 dB and loud, good quality voice.   ? Baseline new goal   ? Time 4   ? Period Weeks   ? Status New   ? Target Date 10/25/21   ?  ? SLP LONG TERM GOAL #2  ? Title Pt will report improved communication effectiveness as measured by Voice Hanicap Index (VHI)   ? Baseline mild to moderate handicap   ? Time 4   ? Period  Weeks   ? Status New   ? Target Date 10/25/21   ?  ? SLP LONG TERM GOAL #3  ? Title Pt will demonstrate improved cognitive linguistic function for IND completion of iADLS tasks in home/community environments   ? Baseline new goal   ? Time 12   ? Period Weeks   ? Status New   ? Target Date 12/19/21   ? ?  ?  ? ?  ? ? ? Plan - 09/29/21 0734   ? ? Clinical Impression Statement Pt presents with mild overall cognitive impairment c/b severe deficits in memory (generative naming impacted by difficulty recalling constraint) and clock drawing, mild deficits in selective attention, executive functions, emergent awareness. These deficits were further supported by pt's performance on the Cognitive Linguistic Quick Test.         ? ? ? ?Cognitive Linguistic Quick Test: AGE - 43 - 45  ? ?The Cognitive Linguistic Quick Test (CLQT) was administered to assess the relative status of five cognitive domains: attention, memory, language, executive functioning, and visuospatial skills. Scores from 10 tasks were used to estimate severity ratings (standardized for age groups 18-69 years and 70-89 years) for each domain, a clock drawing  task, as well as an overall composite severity rating of cognition.    ? ?  ?Task Score Criterion Cut Scores  ?Personal Facts 8/8 8  ?Symbol Cancellation 12/12 11  ?Confrontation Naming 10/10 10  ?Clock Drawing  3/13 12  ?Story Retelling 5/10 6  ?Symbol Trails 5/10 9  ?Generative Naming 5/9 5  ?Design Memory 4/6 5  ?Mazes  6/8 7  ?Design Generation 7/13 6  ? ? ?Cognitive Domain Composite Score Severity Rating  ?Attention 172/215 Mild  ?Memory 131/185 Moderate  ?Executive Function 23/40 Mild  ?Language 28/37 Mild  ?Visuospatial Skills 75/105 Mild  ?Clock Drawing  3/13 Severe  ?Composite Severity Rating  Mild  ? ? ?The Neuro-QOL? Item Bank v2.0-Cognition Function-Short Form is an eight-item test designed to measure difficulties with cognitive functioning (e.g., memory, attention and decision making or in the application of such abilities to everyday tasks (e.g., planning, organizing, calculating, remembering and learning).   ? ?Pt's T-scores:   ?Applied Cognition - General Concerns: MILD   ?Hydrographic surveyor - Executive Function: MILD-MODERATE  ? ?Pt's cognitive impairment if further exacerbated by decreased mental flexibility. Pt describes himself as "over thinking" things. Clinically, question if this is a form of anxiety given history of anxiety. Pt's "over thinking" also results in decreased mental flexibility.  ? ?Skilled ST intervention is recommended to target the above mention cognitive impairment to improve pt's functional independence to decrease pt's need for "outside help" to recall information.  ?  ? Speech Therapy Frequency 4x / week   LSVT LOUD  ? Duration 4 weeks   LSVT LOUD then progress to 2 x week for cognition therapy  ? Treatment/Interventions SLP instruction and feedback;Patient/family education   ? Potential to [place]   ? Potential Considerations --   possible anxiety, decreased mental flexibility  ? Consulted and Agree with Plan of Care Patient;Family member/caregiver   ? Family  Member Consulted pt's wife   ? ?  ?  ? ?  ? ? ?Patient will benefit from skilled therapeutic intervention in order to improve the following deficits and impairments:   ?Cognitive communication deficit ? ?Parkinson's disease ([place]) ? ? ? ?Problem List ?Patient Active Problem List  ? Diagnosis Date Noted  ? 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  Atypical chest pain 01/27/2018  ? 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 Restless leg syndrome 12/15/2017  ? Basal cell carcinoma 10/20/2017  ? GERD (gastroesophageal reflux disease) 10/20/2017  ? Acute bronchospasm 08/29/2016  ? Erectile disorder, acquired, generalized, severe 08/04/2016  ? Adjustment disorder with mixed anxiety and depressed mood 06/21/2015  ? Essential hypertension 06/21/2015  ? Hyperlipidemia, mixed 06/21/2015  ? Hypothyroidism (acquired) 06/21/2015  ? Mixed emotional features as adjustment reaction 06/21/2015  ? 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  ? ?Jonathon Wise, M.S., CCC-SLP, CBIS ?Speech-Language Pathologist ?Rehabilitation Services ?Office [phone] ? ?Jonathon Wise, CCC-SLP ?09/29/2021, 7:39 AM ? ?[facility] ?[place], [place], [address] ?Phone: [phone]   Fax:  [phone] ? ? ?Name: Axiel Fjeld ?MRN: [id number] ?Date of Birth: 1950-11-06 ? ?

## 2021-10-01 ENCOUNTER — Encounter: Payer: Self-pay | Admitting: Speech Pathology

## 2021-10-02 ENCOUNTER — Encounter: Payer: Self-pay | Admitting: Speech Pathology

## 2021-10-03 ENCOUNTER — Encounter: Payer: Self-pay | Admitting: Speech Pathology

## 2021-10-04 ENCOUNTER — Encounter: Payer: Self-pay | Admitting: Speech Pathology

## 2021-10-05 ENCOUNTER — Encounter: Payer: Self-pay | Admitting: Speech Pathology

## 2021-10-08 ENCOUNTER — Encounter: Payer: Self-pay | Admitting: Speech Pathology

## 2021-10-09 ENCOUNTER — Encounter: Payer: Self-pay | Admitting: Speech Pathology

## 2021-10-10 ENCOUNTER — Encounter: Payer: Self-pay | Admitting: Speech Pathology

## 2021-10-11 ENCOUNTER — Encounter: Payer: Self-pay | Admitting: Speech Pathology

## 2021-10-16 ENCOUNTER — Encounter: Payer: Self-pay | Admitting: Speech Pathology

## 2021-10-17 ENCOUNTER — Encounter: Payer: Self-pay | Admitting: Speech Pathology

## 2021-10-18 ENCOUNTER — Encounter: Payer: Self-pay | Admitting: Speech Pathology

## 2021-10-19 ENCOUNTER — Encounter: Payer: Self-pay | Admitting: Speech Pathology

## 2021-10-23 ENCOUNTER — Encounter: Payer: Self-pay | Admitting: Speech Pathology

## 2021-10-24 ENCOUNTER — Encounter: Payer: Self-pay | Admitting: Speech Pathology

## 2021-10-25 ENCOUNTER — Encounter: Payer: Self-pay | Admitting: Speech Pathology

## 2021-10-26 ENCOUNTER — Encounter: Payer: Self-pay | Admitting: Speech Pathology

## 2021-10-30 ENCOUNTER — Ambulatory Visit: Payer: Medicare HMO | Admitting: Speech Pathology

## 2021-11-01 ENCOUNTER — Ambulatory Visit: Payer: Medicare HMO | Admitting: Speech Pathology

## 2021-11-05 ENCOUNTER — Ambulatory Visit: Payer: Medicare HMO | Admitting: Speech Pathology

## 2021-11-07 ENCOUNTER — Ambulatory Visit: Payer: Medicare HMO | Admitting: Speech Pathology

## 2021-11-12 ENCOUNTER — Ambulatory Visit: Payer: Medicare HMO | Admitting: Speech Pathology

## 2021-11-14 ENCOUNTER — Ambulatory Visit: Payer: Medicare HMO | Admitting: Speech Pathology

## 2021-11-20 ENCOUNTER — Ambulatory Visit: Payer: Medicare HMO | Admitting: Speech Pathology

## 2021-11-22 ENCOUNTER — Ambulatory Visit: Payer: Medicare HMO | Admitting: Speech Pathology

## 2021-11-26 ENCOUNTER — Ambulatory Visit: Payer: Medicare HMO | Admitting: Speech Pathology

## 2021-11-28 ENCOUNTER — Ambulatory Visit: Payer: Medicare HMO | Admitting: Speech Pathology

## 2021-12-04 ENCOUNTER — Ambulatory Visit: Payer: Medicare HMO | Admitting: Speech Pathology

## 2021-12-06 ENCOUNTER — Ambulatory Visit: Payer: Medicare HMO | Admitting: Speech Pathology

## 2021-12-11 ENCOUNTER — Ambulatory Visit: Payer: Medicare HMO | Admitting: Speech Pathology

## 2021-12-13 ENCOUNTER — Ambulatory Visit: Payer: Medicare HMO | Admitting: Speech Pathology

## 2021-12-18 ENCOUNTER — Ambulatory Visit: Payer: Medicare HMO | Admitting: Speech Pathology

## 2021-12-20 ENCOUNTER — Ambulatory Visit: Payer: Medicare HMO | Admitting: Speech Pathology

## 2021-12-25 ENCOUNTER — Ambulatory Visit: Payer: Medicare HMO | Admitting: Speech Pathology

## 2021-12-27 ENCOUNTER — Ambulatory Visit: Payer: Medicare HMO | Admitting: Speech Pathology

## 2022-01-01 ENCOUNTER — Ambulatory Visit: Payer: Medicare HMO | Admitting: Speech Pathology

## 2022-01-03 ENCOUNTER — Ambulatory Visit: Payer: Medicare HMO | Admitting: Speech Pathology

## 2022-01-07 ENCOUNTER — Ambulatory Visit: Payer: Medicare HMO | Admitting: Speech Pathology

## 2022-01-10 ENCOUNTER — Ambulatory Visit: Payer: Medicare HMO | Admitting: Speech Pathology

## 2022-01-15 ENCOUNTER — Ambulatory Visit: Payer: Medicare HMO | Admitting: Speech Pathology

## 2022-01-17 ENCOUNTER — Ambulatory Visit: Payer: Medicare HMO | Admitting: Speech Pathology

## 2022-01-22 ENCOUNTER — Ambulatory Visit: Payer: Medicare HMO | Admitting: Speech Pathology

## 2022-01-24 ENCOUNTER — Ambulatory Visit: Payer: Medicare HMO | Admitting: Speech Pathology

## 2022-01-29 ENCOUNTER — Ambulatory Visit: Payer: Medicare HMO | Admitting: Speech Pathology

## 2022-01-31 ENCOUNTER — Ambulatory Visit: Payer: Medicare HMO | Admitting: Speech Pathology

## 2022-08-09 ENCOUNTER — Encounter: Payer: Self-pay | Admitting: Urology

## 2022-08-09 ENCOUNTER — Ambulatory Visit: Payer: Medicare HMO | Admitting: Urology

## 2022-08-09 VITALS — BP 187/77 | HR 79 | Ht 67.0 in | Wt 204.6 lb

## 2022-08-09 DIAGNOSIS — N4883 Acquired buried penis: Secondary | ICD-10-CM

## 2022-08-09 DIAGNOSIS — N529 Male erectile dysfunction, unspecified: Secondary | ICD-10-CM

## 2022-08-09 NOTE — Progress Notes (Signed)
Haze Rushing Plume,acting as a Education administrator for Hollice Espy, MD.,have documented all relevant documentation on the behalf of Hollice Espy, MD,as directed by  Hollice Espy, MD while in the presence of Hollice Espy, MD.  08/09/2022 2:59 PM   Ronnette Hila 10-29-1950 [id number]  Referring provider: Hughie Closs, PA-C [place],  [place] [address]  Chief Complaint  Patient presents with   Erectile Dysfunction    HPI: 72 year-old male with a personal history of erectile dysfunction.  He was last seen by me two years ago in 10/2019. He was having gradually worsening erectile dysfunction.  He is not able to tolerate PDE5 inhibitors. 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 He did undergo a penile shockwave without improvement. At that time, we had discussed vacuum erection device versus penile prosthesis versus intracavernosal injections. He was scheduled to return for a Trimix injection with [facility] but it appears that he did not follow through.   He returns today with continued erectile dysfunction. He also complains of buried penis symptoms.He has had this issue for a few years but he reports that he gained approximately 30 lbs last year which seems to have worsened his symptoms.   He reports recent weight loss associated with an upcoming knee replacement.   PMH: Past Medical History:  Diagnosis Date   Anxiety    Diabetes mellitus without complication ([facility])    GERD (gastroesophageal reflux disease)    Hyperlipidemia    Hypertension    Thyroid activity decreased     Surgical History: Past Surgical History:  Procedure Laterality Date   JOINT REPLACEMENT Right    index   MENISCUS REPAIR     rotate     ROTATOR CUFF REPAIR     2 on right and 2 on left   TONSILLECTOMY      Home Medications:   Allergies as of 08/09/2022       Reactions   Fentanyl Shortness Of Breath   Ropinirole    Other Reaction(s): Hallucination        Medication List        Accurate as of August 09, 2022  2:59 PM. If you have any questions, ask your nurse or doctor.          STOP taking these medications    LORazepam 1 MG tablet Commonly known as: ATIVAN Stopped by: Hollice Espy, MD   triamcinolone acetonide 40 MG/ML Susp Commonly known as: TRIESENCE Stopped by: Hollice Espy, MD       TAKE these medications    acetaminophen 650 MG CR tablet Commonly known as: TYLENOL Take 650 mg by mouth every 8 (eight) hours.   aspirin EC 81 MG tablet Take 81 mg by mouth daily.   Calcium 600+D 600-800 MG-UNIT Tabs Generic drug: Calcium Carb-Cholecalciferol Take 1 tablet by mouth daily.   clonazePAM 0.5 MG tablet Commonly known as: KLONOPIN Take by mouth. What changed: Another medication with the same name was removed. Continue taking this medication, and follow the directions you see here. Changed by: Hollice Espy, MD   flecainide 50 MG tablet Commonly known as: TAMBOCOR Take 50 mg by mouth 2 (two) times daily.   fluticasone 50 MCG/ACT nasal spray Commonly known as: FLONASE Place 2 sprays into both nostrils daily.   folic acid 1 MG tablet Commonly known as:  FOLVITE Take 1 mg by mouth daily.   levothyroxine 137 MCG tablet Commonly known as: SYNTHROID Take 1 tablet by mouth daily. What changed: Another medication with the same name was removed. Continue taking this medication, and follow the directions you see here. Changed by: Hollice Espy, MD   lisinopril 20 MG tablet Commonly known as: ZESTRIL Take 20 mg by mouth daily. What changed: Another medication with the same name was removed. Continue taking this medication, and follow the directions you see here. Changed by: Hollice Espy, MD   meloxicam 15 MG tablet Commonly known as: MOBIC Take 1 tablet by mouth  daily.   methotrexate 2.5 MG tablet Commonly known as: RHEUMATREX Take 2.5 mg by mouth once a week.   metoprolol succinate 50 MG 24 hr tablet Commonly known as: TOPROL-XL Take 50 mg by mouth daily. Take with or immediately following a meal.   multivitamin tablet Take 1 tablet by mouth daily.   NONFORMULARY OR COMPOUNDED ITEM Trimix (30/1/10)-(Pap/Phent/PGE)  Test Dose  12m vial   Qty #3 Refills 0  [phone] [phone]  Omega-3 1000 MG Caps Take 1 capsule by mouth daily.   omeprazole 20 MG capsule Commonly known as: PRILOSEC Take 20 mg by mouth 2 (two) times daily before a meal.   pramipexole 1 MG tablet Commonly known as: MIRAPEX Take 1 mg by mouth at bedtime. What changed: Another medication with the same name was removed. Continue taking this medication, and follow the directions you see here. Changed by: AHollice Espy MD   Precision QID Test test strip Generic drug: glucose blood 1 each (1 strip total) 3 (three) times daily Use as instructed. One touch verio   PROBIOTIC DAILY PO Take 1 tablet by mouth daily.   rivastigmine 9.5 mg/24hr Commonly known as: EXELON Place 9.5 mg onto the skin daily.   simvastatin 10 MG tablet Commonly known as: ZOCOR Take 10 mg by mouth daily.   tadalafil 10 MG tablet Commonly known as: CIALIS Take 0.5 tablets (5 mg total) by mouth daily as needed for erectile dysfunction.   Testosterone 12.5 MG/ACT (1%) Gel 2 Pump once daily   Turmeric Curcumin 500 MG Caps Take 1 capsule by mouth daily.   valACYclovir 500 MG tablet Commonly known as: VALTREX Take 1,000 mg by mouth 2 (two) times daily. X 1 day for fever blisters        Allergies:  Allergies  Allergen Reactions   Fentanyl Shortness Of Breath   Ropinirole     Other Reaction(s): Hallucination    Family History: Family History  Problem Relation Age of Onset   Diabetes Sister    Diabetes Maternal Aunt    Diabetes Maternal  Uncle    Diabetes Maternal Aunt     Social History:  reports that he has never smoked. He has never used smokeless tobacco. He reports that he does not drink alcohol and does not use drugs.   Physical Exam: BP (!) 187/77 (BP Location: Left Arm, Patient Position: Sitting, Cuff Size: Normal)   Pulse 79   Ht '5\' 7"'$  (1.702 m)   Wt 204 lb 9.6 oz (92.8 kg)   BMI 32.04 kg/m   Constitutional:  Alert and oriented, No acute distress. HEENT: [place] AT, moist mucus membranes.  Trachea midline, no masses. GU: Large suprapubic fat pad and buried phallus. Circumcised, otherwise normal penis.  He also had pictures demonstrating this.  Neurologic: Grossly intact, no focal deficits, moving all 4 extremities. Psychiatric: Normal  mood and affect.  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 He could also pursue penile stretching which may help or a vacuum erection device.   2. Erectile dysfunction - We discussed the pathophysiology of erectile dysfunction today along with possible contributing factors. Discussed possible treatment options including PDE 5 inhibitors, vacuum erectile device, intracavernosal injection, MUSE, and placement of the inflatable or malleable penile prosthesis for refractory cases.  In terms of PDE 5 inhibitors, we discussed contraindications for this medication as well as common side effects. Patient was counseled on optimal use. All of his questions were answered in detail.  - This has been extensively discussed in the past. He is primarily interested in this for self stimulation. -He will try Cialis again. -He will let us know if he would like to pursue injections. He is not interested in a prosthesis.   Return if symptoms worsen or fail to improve.  [place] [address], [place], [place] [address] [phone]

## 2023-04-07 ENCOUNTER — Other Ambulatory Visit: Payer: Self-pay | Admitting: Internal Medicine

## 2023-04-07 DIAGNOSIS — I10 Essential (primary) hypertension: Secondary | ICD-10-CM

## 2023-04-07 DIAGNOSIS — R002 Palpitations: Secondary | ICD-10-CM

## 2023-04-08 ENCOUNTER — Ambulatory Visit
Admission: RE | Admit: 2023-04-08 | Discharge: 2023-04-08 | Disposition: A | Discharge status: Home / Self Care | Payer: Medicare HMO | Source: Ambulatory Visit | Attending: Internal Medicine | Admitting: Internal Medicine

## 2023-04-08 DIAGNOSIS — I10 Essential (primary) hypertension: Secondary | ICD-10-CM | POA: Insufficient documentation

## 2023-04-08 DIAGNOSIS — R002 Palpitations: Secondary | ICD-10-CM | POA: Insufficient documentation

## 2024-03-29 ENCOUNTER — Encounter: Payer: Self-pay | Admitting: Oncology

## 2024-03-29 ENCOUNTER — Inpatient Hospital Stay: Attending: Oncology | Admitting: Oncology

## 2024-03-29 ENCOUNTER — Inpatient Hospital Stay

## 2024-03-29 VITALS — BP 136/55 | HR 60 | Temp 98.6°F | Resp 17 | Ht 67.0 in | Wt 201.0 lb

## 2024-03-29 DIAGNOSIS — D7219 Other eosinophilia: Secondary | ICD-10-CM | POA: Diagnosis not present

## 2024-03-29 LAB — CBC WITH DIFFERENTIAL (CANCER CENTER ONLY)
Abs Immature Granulocytes: 0 K/uL (ref 0.00–0.07)
Band Neutrophils: 0 %
Basophils Absolute: 0 K/uL (ref 0.0–0.1)
Basophils Relative: 0 %
Blasts: 0 %
Eosinophils Absolute: 0.8 K/uL — ABNORMAL HIGH (ref 0.0–0.5)
Eosinophils Relative: 22 %
HCT: 36.5 % — ABNORMAL LOW (ref 39.0–52.0)
Hemoglobin: 12.8 g/dL — ABNORMAL LOW (ref 13.0–17.0)
Immature Granulocytes: 0 %
Lymphocytes Relative: 32 %
Lymphs Abs: 1.2 K/uL (ref 0.7–4.0)
MCH: 34.6 pg — ABNORMAL HIGH (ref 26.0–34.0)
MCHC: 35.1 g/dL (ref 30.0–36.0)
MCV: 98.6 fL (ref 80.0–100.0)
Metamyelocytes Relative: 0 %
Monocytes Absolute: 0.4 K/uL (ref 0.1–1.0)
Monocytes Relative: 10 %
Myelocytes: 0 %
Neutro Abs: 1.2 K/uL — ABNORMAL LOW (ref 1.7–7.7)
Neutrophils Relative %: 36 %
Other: 0 %
Platelet Count: 151 K/uL (ref 150–400)
Promyelocytes Relative: 0 %
RBC: 3.7 MIL/uL — ABNORMAL LOW (ref 4.22–5.81)
RDW: 13.6 % (ref 11.5–15.5)
WBC Count: 3.6 K/uL — ABNORMAL LOW (ref 4.0–10.5)
nRBC: 0 % (ref 0.0–0.2)
nRBC: 0 /100{WBCs}

## 2024-03-29 LAB — TECHNOLOGIST SMEAR REVIEW: Plt Morphology: ADEQUATE

## 2024-03-29 LAB — VITAMIN B12: Vitamin B-12: 637 pg/mL (ref 180–914)

## 2024-03-29 NOTE — Progress Notes (Signed)
 Patient here for initial heme appointment, expresses no concerns other than fatigue

## 2024-03-29 NOTE — Progress Notes (Signed)
 Hematology/Oncology Consult note [facility] [phone] [phone]  Patient Care Team: Perri Halim Kelly, PA-C as PCP - General (Physician Assistant)   Name of the patient: Jonathon Wise  [id number]  1951/02/11    Reason for referral-eosinophilia   Referring physician-Dr. Lady Blanch  Date of visit: 03/29/24   History of presenting illness- Patient is a 73 year old male with a history of rheumatoid arthritis stage II for which she follows up with rheumatology.  He is presently on methotrexate and folic acid as well as Tylenol and meloxicam  for pain control.  He has been referred for eosinophilia.  Recent CBC from 03/16/2024 showed white cell count of 4.7, H&H of 13/37 with an MCV of 98.4.  Patient was noted to have 28.4% eosinophils with an absolute eosinophil count of 1.34.  Looking back at his prior CBCs patient has had some chronic eosinophilia at least since March 2023 with a waxing and waning eosinophil percentage between 10 to 28%.  No clear rising trend.  Platelet counts have been normal.  Patient reports some ongoing fatigue which has been gradual over the last 1 year.  Denies any cough or shortness of breath on exertion.  Denies any chest pain nausea vomiting or diarrhea.  Denies any changes in his appetite or weight.  Denies any unintentional weight loss.  ECOG PS- 1  Pain scale- 4   Review of systems- Review of Systems  Constitutional:  Positive for malaise/fatigue. Negative for chills, fever and weight loss.  HENT:  Negative for congestion, ear discharge and nosebleeds.   Eyes:  Negative for blurred vision.  Respiratory:  Negative for cough, hemoptysis, sputum production, shortness of breath and wheezing.   Cardiovascular:  Negative for chest pain, palpitations, orthopnea and claudication.  Gastrointestinal:  Negative for abdominal pain, blood in stool, constipation, diarrhea, heartburn, melena, nausea and vomiting.   Genitourinary:  Negative for dysuria, flank pain, frequency, hematuria and urgency.  Musculoskeletal:  Positive for joint pain. Negative for back pain and myalgias.  Skin:  Negative for rash.  Neurological:  Negative for dizziness, tingling, focal weakness, seizures, weakness and headaches.  Endo/Heme/Allergies:  Does not bruise/bleed easily.  Psychiatric/Behavioral:  Negative for depression and suicidal ideas. The patient does not have insomnia.     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   Restless leg syndrome 12/15/2017   Basal cell carcinoma 10/20/2017   GERD (gastroesophageal reflux disease) 10/20/2017   Acute bronchospasm 08/29/2016   Erectile disorder, acquired, generalized, severe 08/04/2016   Adjustment disorder with mixed anxiety and depressed mood 06/21/2015   Essential hypertension 06/21/2015   Hyperlipidemia, mixed 06/21/2015   Hypothyroidism (acquired) 06/21/2015   Mixed emotional features as adjustment reaction 06/21/2015   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     Past Medical History:  Diagnosis Date   Anxiety    Diabetes mellitus without complication (HCC)    GERD (gastroesophageal reflux disease)    Hyperlipidemia    Hypertension    Thyroid activity decreased      Past Surgical History:  Procedure Laterality Date   JOINT REPLACEMENT Right    index   MENISCUS REPAIR     rotate     ROTATOR CUFF REPAIR     2 on right and  2 on left   TONSILLECTOMY      Social History   Socioeconomic History   Marital status: Married    Spouse name: Not on file   Number of children: Not on file   Years of education: Not on file   Highest education level: Not on file  Occupational History   Not on file  Tobacco Use   Smoking  status: Never   Smokeless tobacco: Never  Substance and Sexual Activity   Alcohol use: No    Comment: rarely - few in the last 3 years   Drug use: No   Sexual activity: Not on file  Other Topics Concern   Not on file  Social History Narrative   Not on file   Social Drivers of Health   Financial Resource Strain: Low Risk  (11/05/2023)   Received from [facility]   Overall Financial Resource Strain (CARDIA)    Difficulty of Paying Living Expenses: Not hard at all  Food Insecurity: No Food Insecurity (03/29/2024)   Hunger Vital Sign    Worried About Running Out of Food in the Last Year: Never true    Ran Out of Food in the Last Year: Never true  Transportation Needs: No Transportation Needs (11/05/2023)   Received from [facility] - Transportation    In the past 12 months, has lack of transportation kept you from medical appointments or from getting medications?: No    Lack of Transportation (Non-Medical): No  Physical Activity: Not on file  Stress: Not on file  Social Connections: Not on file  Intimate Partner Violence: Not At Risk (03/29/2024)   Humiliation, Afraid, Rape, and Kick questionnaire    Fear of Current or Ex-Partner: No    Emotionally Abused: No    Physically Abused: No    Sexually Abused: No     Family History  Problem Relation Age of Onset   Diabetes Sister    Diabetes Maternal Aunt    Diabetes Maternal Uncle    Diabetes Maternal Aunt      Current Outpatient Medications:    acetaminophen (TYLENOL) 650 MG CR tablet, Take 650 mg by mouth every 8 (eight) hours., Disp: , Rfl:    aspirin EC 81 MG tablet, Take 81 mg by mouth daily., Disp: , Rfl:    Docusate Sodium (DSS) 100 MG CAPS, Take 1 capsule by mouth daily., Disp: , Rfl:    flecainide (TAMBOCOR) 50 MG tablet, Take 50 mg by mouth 2 (two) times daily., Disp: , Rfl:    fluticasone (FLONASE) 50 MCG/ACT nasal spray, Place 2 sprays into both nostrils daily. , Disp: ,  Rfl:    folic acid (FOLVITE) 1 MG tablet, Take 1 mg by mouth., Disp: , Rfl:    glucose blood (PRECISION QID TEST) test strip, 1 each (1 strip total) 3 (three) times daily Use as instructed. One touch verio, Disp: , Rfl:    levothyroxine (SYNTHROID) 137 MCG tablet, Take 1 tablet by mouth daily., Disp: , Rfl:    lisinopril (ZESTRIL) 20 MG tablet, Take 20 mg by mouth daily., Disp: , Rfl:    Magnesium Oxide -Mg Supplement 500 MG TABS, Take 500 mg by mouth., Disp: , Rfl:    meloxicam  (MOBIC ) 15 MG tablet, Take 1 tablet by mouth daily., Disp: , Rfl:    methotrexate (RHEUMATREX) 2.5 MG tablet, Take 2.5 mg by mouth once a week., Disp: , Rfl:    metoprolol succinate (TOPROL-XL) 50 MG  24 hr tablet, Take 50 mg by mouth daily. Take with or immediately following a meal., Disp: , Rfl:    Multiple Vitamin (MULTIVITAMIN) tablet, Take 1 tablet by mouth daily., Disp: , Rfl:    NONFORMULARY OR COMPOUNDED ITEM, Trimix (30/1/10)-(Pap/Phent/PGE)  Test Dose  1ml vial   Qty #3 Refills 0  [facility] [phone] Fax [phone], Disp: 3 each, Rfl: 0   Omega-3 1000 MG CAPS, Take 1 capsule by mouth daily., Disp: , Rfl:    omeprazole (PRILOSEC) 20 MG capsule, Take 20 mg by mouth 2 (two) times daily before a meal. , Disp: , Rfl:    Probiotic Product (PROBIOTIC DAILY PO), Take 1 tablet by mouth daily., Disp: , Rfl:    simvastatin (ZOCOR) 10 MG tablet, Take 10 mg by mouth daily., Disp: , Rfl:    Turmeric Curcumin 500 MG CAPS, Take 1 capsule by mouth daily., Disp: , Rfl:    valACYclovir (VALTREX) 500 MG tablet, Take 1,000 mg by mouth 2 (two) times daily. X 1 day for fever blisters, Disp: , Rfl:    Calcium Carb-Cholecalciferol (CALCIUM 600+D) 600-800 MG-UNIT TABS, Take 1 tablet by mouth daily., Disp: , Rfl:    clonazePAM (KLONOPIN) 0.5 MG tablet, Take by mouth. (Patient not taking: Reported on 03/29/2024), Disp: , Rfl:    rivastigmine (EXELON) 9.5 mg/24hr, Place 9.5 mg onto the skin daily., Disp: , Rfl:    tadalafil   (CIALIS ) 10 MG tablet, Take 0.5 tablets (5 mg total) by mouth daily as needed for erectile dysfunction. (Patient not taking: Reported on 03/29/2024), Disp: 90 tablet, Rfl: 1   Testosterone  12.5 MG/ACT (1%) GEL, 2 Pump once daily, Disp: , Rfl:    Physical exam:  Vitals:   03/29/24 1101  BP: (!) 136/55  Pulse: 60  Resp: 17  Temp: 98.6 F (37 C)  TempSrc: Tympanic  SpO2: 99%  Weight: 201 lb (91.2 kg)  Height: 5' 7 (1.702 m)   Physical Exam Cardiovascular:     Rate and Rhythm: Normal rate and regular rhythm.     Heart sounds: Normal heart sounds.  Pulmonary:     Effort: Pulmonary effort is normal.     Breath sounds: Normal breath sounds.  Abdominal:     General: Bowel sounds are normal.     Palpations: Abdomen is soft.     Comments: No palpable hepatosplenomegaly  Lymphadenopathy:     Comments: No palpable cervical, supraclavicular, axillary or inguinal adenopathy    Skin:    General: Skin is warm and dry.  Neurological:     Mental Status: He is alert and oriented to person, place, and time.           Latest Ref Rng & Units 06/28/2019    2:45 PM  CMP  Creatinine 0.61 - 1.24 mg/dL 9.09        No data to display          Assessment and plan- Patient is a 73 y.o. male referred for eosinophilia   Eosinophilia can be seen in various conditions such as allergic infectious neoplastic or immunological disorders.  From a hematology standpoint I will be working him up for [facility] and systemic mastocytosis and I will check a BCR-ABL as well as serum tryptase levels and B12 today.  I will also obtain smear review and peripheral flow cytometry.  Eosinophilia most commonly is seen in an allergic conditions and patient may benefit from referral to allergy immunology.   Thank you for this kind referral  and the opportunity to participate in the care of this patient   Visit Diagnosis 1. Other eosinophilia     Dr. Annah Skene, MD, MPH [facility] at [facility] [phone] 03/29/2024

## 2024-03-30 LAB — TRYPTASE: Tryptase: 5.4 ug/L (ref 2.2–13.2)

## 2024-04-06 LAB — COMP PANEL: LEUKEMIA/LYMPHOMA

## 2024-04-07 LAB — BCR-ABL1 FISH
Cells Analyzed: 200
Cells Counted: 200

## 2024-04-15 ENCOUNTER — Telehealth: Admitting: Oncology

## 2024-04-22 ENCOUNTER — Telehealth: Admitting: Oncology

## 2024-05-04 ENCOUNTER — Ambulatory Visit: Admitting: Oncology

## 2024-05-05 ENCOUNTER — Encounter: Payer: Self-pay | Admitting: Oncology

## 2024-05-05 ENCOUNTER — Inpatient Hospital Stay: Attending: Oncology | Admitting: Oncology

## 2024-05-05 VITALS — BP 131/70 | HR 54 | Temp 98.1°F | Resp 18 | Ht 67.0 in | Wt 199.6 lb

## 2024-05-05 DIAGNOSIS — M069 Rheumatoid arthritis, unspecified: Secondary | ICD-10-CM | POA: Insufficient documentation

## 2024-05-05 DIAGNOSIS — D7219 Other eosinophilia: Secondary | ICD-10-CM | POA: Insufficient documentation

## 2024-05-05 NOTE — Progress Notes (Unsigned)
 Hematology/Oncology Consult note [facility]  Telephone:([phone] [phone]  Patient Care Team: Powell, Mychal Kelly, PA-C as PCP - General (Physician Assistant)   Name of the patient: Jonathon Wise  [id number]  Oct 08, 1950   Date of visit: 05/05/24  Diagnosis- mild eosinophilia of unclear etiology  Chief complaint/ Reason for visit- discuss results of bloodwork  Heme/Onc history: Patient is a 73 year old male with a history of rheumatoid arthritis stage II for which she follows up with rheumatology.  He is presently on methotrexate and folic acid as well as Tylenol and meloxicam  for pain control.  He has been referred for eosinophilia.   Recent CBC from 03/16/2024 showed white cell count of 4.7, H&H of 13/37 with an MCV of 98.4.  Patient was noted to have 28.4% eosinophils with an absolute eosinophil count of 1.34.  Looking back at his prior CBCs patient has had some chronic eosinophilia at least since March 2023 with a waxing and waning eosinophil percentage between 10 to 28%.  No clear rising trend.  Platelet counts have been normal.   Patient reports some ongoing fatigue which has been gradual over the last 1 year.  Denies any cough or shortness of breath on exertion.  Denies any chest pain nausea vomiting or diarrhea.  Denies any changes in his appetite or weight.  Denies any unintentional weight loss.   Results of blood work from 03/29/2024 showed white cell count of 3.6 with an H&H of 12.8/36.5 and a platelet count of 151.  Differential showed mild neutropenia with an ANC of 1.2 and mild eosinophilia with an absolute eosinophil count of 800.  Smear review was unremarkable.  Flow cytometry did not show any immunophenotypic abnormality.  B12 levels were normal at 637.  Tryptase levels were normal.  BCR-ABL FISH testing negative.  Interval history- Discussed the use of AI scribe software for clinical note transcription with the patient, who gave verbal  consent to proceed.  History of Present Illness   Jonathon Wise is a 73 year old male who presents with elevated eosinophil count. He was referred by his primary care provider for evaluation of elevated eosinophil count.  He has a slightly elevated eosinophil count, with levels around 700-800, where 500 is the upper limit of normal. A comprehensive workup has been conducted to rule out cancer-related causes for the elevated eosinophil count, including tests for chronic myeloid leukemia and flow cytometry to analyze white cells, all of which returned negative results.  He has a history of allergies during childhood, stating he was allergic to everything, but currently only experiences an itchy nose for which he uses Flonase. No major allergy symptoms are present at this time.  He also has a history of rheumatoid arthritis, which is associated with a consistently low white blood cell count, particularly neutrophils. He undergoes regular blood work every three months with his rheumatology doctor, which includes a complete blood count (CBC).       ECOG PS- 1 Pain scale- 0   Review of systems- Review of Systems  Constitutional:  Positive for malaise/fatigue. Negative for chills, fever and weight loss.  HENT:  Negative for congestion, ear discharge and nosebleeds.   Eyes:  Negative for blurred vision.  Respiratory:  Negative for cough, hemoptysis, sputum production, shortness of breath and wheezing.   Cardiovascular:  Negative for chest pain, palpitations, orthopnea and claudication.  Gastrointestinal:  Negative for abdominal pain, blood in stool, constipation, diarrhea, heartburn, melena, nausea and vomiting.  Genitourinary:  Negative for dysuria, flank pain, frequency, hematuria and urgency.  Musculoskeletal:  Negative for back pain, joint pain and myalgias.  Skin:  Negative for rash.  Neurological:  Negative for dizziness, tingling, focal weakness, seizures, weakness and  headaches.  Endo/Heme/Allergies:  Does not bruise/bleed easily.  Psychiatric/Behavioral:  Negative for depression and suicidal ideas. The patient does not have insomnia.       Allergies  Allergen Reactions   Fentanyl Shortness Of Breath   Ropinirole     Other Reaction(s): Hallucination     Past Medical History:  Diagnosis Date   Anxiety    Diabetes mellitus without complication (HCC)    GERD (gastroesophageal reflux disease)    Hyperlipidemia    Hypertension    Thyroid activity decreased      Past Surgical History:  Procedure Laterality Date   JOINT REPLACEMENT Right    index   MENISCUS REPAIR     rotate     ROTATOR CUFF REPAIR     2 on right and 2 on left   TONSILLECTOMY      Social History   Socioeconomic History   Marital status: Married    Spouse name: Not on file   Number of children: Not on file   Years of education: Not on file   Highest education level: Not on file  Occupational History   Not on file  Tobacco Use   Smoking status: Never   Smokeless tobacco: Never  Substance and Sexual Activity   Alcohol use: No    Comment: rarely - few in the last 3 years   Drug use: No   Sexual activity: Not on file  Other Topics Concern   Not on file  Social History Narrative   Not on file   Social Drivers of Health   Financial Resource Strain: Low Risk  (11/05/2023)   Received from [facility]   Overall Financial Resource Strain (CARDIA)    Difficulty of Paying Living Expenses: Not hard at all  Food Insecurity: No Food Insecurity (03/29/2024)   Hunger Vital Sign    Worried About Running Out of Food in the Last Year: Never true    Ran Out of Food in the Last Year: Never true  Transportation Needs: No Transportation Needs (11/05/2023)   Received from [facility] - Transportation    In the past 12 months, has lack of transportation kept you from medical appointments or from getting medications?: No    Lack of  Transportation (Non-Medical): No  Physical Activity: Not on file  Stress: Not on file  Social Connections: Not on file  Intimate Partner Violence: Not At Risk (03/29/2024)   Humiliation, Afraid, Rape, and Kick questionnaire    Fear of Current or Ex-Partner: No    Emotionally Abused: No    Physically Abused: No    Sexually Abused: No    Family History  Problem Relation Age of Onset   Diabetes Sister    Diabetes Maternal Aunt    Diabetes Maternal Uncle    Diabetes Maternal Aunt      Current Outpatient Medications:    acetaminophen (TYLENOL) 650 MG CR tablet, Take 650 mg by mouth every 8 (eight) hours., Disp: , Rfl:    aspirin EC 81 MG tablet, Take 81 mg by mouth daily., Disp: , Rfl:    Docusate Sodium (DSS) 100 MG CAPS, Take 1 capsule by mouth daily., Disp: , Rfl:    flecainide (TAMBOCOR) 50  MG tablet, Take 50 mg by mouth 2 (two) times daily., Disp: , Rfl:    fluticasone (FLONASE) 50 MCG/ACT nasal spray, Place 2 sprays into both nostrils daily. , Disp: , Rfl:    folic acid (FOLVITE) 1 MG tablet, Take 1 mg by mouth., Disp: , Rfl:    glucose blood (PRECISION QID TEST) test strip, 1 each (1 strip total) 3 (three) times daily Use as instructed. One touch verio, Disp: , Rfl:    levothyroxine (SYNTHROID) 137 MCG tablet, Take 1 tablet by mouth daily., Disp: , Rfl:    lisinopril (ZESTRIL) 20 MG tablet, Take 20 mg by mouth daily., Disp: , Rfl:    Magnesium Oxide -Mg Supplement 500 MG TABS, Take 500 mg by mouth., Disp: , Rfl:    meloxicam  (MOBIC ) 15 MG tablet, Take 1 tablet by mouth daily., Disp: , Rfl:    methotrexate (RHEUMATREX) 2.5 MG tablet, Take 2.5 mg by mouth once a week., Disp: , Rfl:    metoprolol succinate (TOPROL-XL) 50 MG 24 hr tablet, Take 50 mg by mouth daily. Take with or immediately following a meal., Disp: , Rfl:    Multiple Vitamin (MULTIVITAMIN) tablet, Take 1 tablet by mouth daily., Disp: , Rfl:    NONFORMULARY OR COMPOUNDED ITEM, Trimix (30/1/10)-(Pap/Phent/PGE)  Test  Dose  1ml vial   Qty #3 Refills 0  [facility] [phone] Fax [phone], Disp: 3 each, Rfl: 0   Omega-3 1000 MG CAPS, Take 1 capsule by mouth daily., Disp: , Rfl:    omeprazole (PRILOSEC) 20 MG capsule, Take 20 mg by mouth 2 (two) times daily before a meal. , Disp: , Rfl:    Probiotic Product (PROBIOTIC DAILY PO), Take 1 tablet by mouth daily., Disp: , Rfl:    simvastatin (ZOCOR) 10 MG tablet, Take 10 mg by mouth daily., Disp: , Rfl:    Turmeric Curcumin 500 MG CAPS, Take 1 capsule by mouth daily., Disp: , Rfl:    valACYclovir (VALTREX) 500 MG tablet, Take 1,000 mg by mouth 2 (two) times daily. X 1 day for fever blisters, Disp: , Rfl:    Calcium Carb-Cholecalciferol (CALCIUM 600+D) 600-800 MG-UNIT TABS, Take 1 tablet by mouth daily., Disp: , Rfl:    clonazePAM (KLONOPIN) 0.5 MG tablet, Take by mouth. (Patient not taking: Reported on 03/29/2024), Disp: , Rfl:    rivastigmine (EXELON) 9.5 mg/24hr, Place 9.5 mg onto the skin daily., Disp: , Rfl:    tadalafil  (CIALIS ) 10 MG tablet, Take 0.5 tablets (5 mg total) by mouth daily as needed for erectile dysfunction. (Patient not taking: Reported on 03/29/2024), Disp: 90 tablet, Rfl: 1   Testosterone  12.5 MG/ACT (1%) GEL, 2 Pump once daily, Disp: , Rfl:   Physical exam:  Vitals:   05/05/24 1306  BP: 131/70  Pulse: (!) 54  Resp: 18  Temp: 98.1 F (36.7 C)  TempSrc: Tympanic  SpO2: 100%  Weight: 199 lb 9.6 oz (90.5 kg)  Height: 5' 7 (1.702 m)   Physical Exam Cardiovascular:     Rate and Rhythm: Normal rate and regular rhythm.     Heart sounds: Normal heart sounds.  Pulmonary:     Effort: Pulmonary effort is normal.     Breath sounds: Normal breath sounds.  Musculoskeletal:     Cervical back: Normal range of motion.  Skin:    General: Skin is warm and dry.  Neurological:     Mental Status: He is alert and oriented to person, place, and time.  I have personally reviewed labs listed below:    Latest Ref Rng & Units  06/28/2019    2:45 PM  CMP  Creatinine 0.61 - 1.24 mg/dL 9.09       Latest Ref Rng & Units 03/29/2024   11:41 AM  CBC  WBC 4.0 - 10.5 K/uL 3.6   Hemoglobin 13.0 - 17.0 g/dL 87.1   Hematocrit 60.9 - 52.0 % 36.5   Platelets 150 - 400 K/uL 151     Assessment and plan- Patient is a 73 y.o. male referred for eosinophilia     Eosinophilia Eosinophil count slightly elevated at 700-800.  Workup including BCR-ABL FISH testing, flow cytometry and tryptase normal.  There is no overt concern for systemic mastocytosis based on his blood work.  Mild eosinophilia may be secondary to underlying allergies which seem to be mild.  Monitoring due to stability and lack of symptoms. - Monitor eosinophil count and repeat in three months. - Schedule follow-up appointment in six months to review eosinophil count.  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 No major intervention needed. - Continue monitoring through regular blood work with rheumatology every three months.         Visit Diagnosis 1. Other eosinophilia      Dr. Annah Skene, MD, MPH [facility] [phone] 05/05/2024 1:10 PM

## 2024-05-05 NOTE — Progress Notes (Unsigned)
 Patient doing well, here for results.

## 2024-05-07 ENCOUNTER — Telehealth: Payer: Self-pay

## 2024-05-07 NOTE — Telephone Encounter (Signed)
 Per Dr. 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 No major intervention needed. Continue monitoring through regular blood work with rheumatology every three months.  Outbound call to rheumatology; office is closed.  Will try calling again on Monday.

## 2024-05-11 NOTE — Telephone Encounter (Signed)
 Spoke to Megan at [facility], informed of below.  No further follow up needed.

## 2024-11-03 ENCOUNTER — Inpatient Hospital Stay: Admitting: Oncology
